# Patient Record
Sex: Female | Born: 1995 | Race: White | Hispanic: No | Marital: Single | Smoking: Never smoker
Health system: Southern US, Community
[De-identification: ages and names within clinical notes are randomized; demographics above are authoritative.]

## PROBLEM LIST (undated history)

## (undated) DIAGNOSIS — M549 Dorsalgia, unspecified: Secondary | ICD-10-CM

## (undated) DIAGNOSIS — G8929 Other chronic pain: Secondary | ICD-10-CM

## (undated) DIAGNOSIS — J45909 Unspecified asthma, uncomplicated: Secondary | ICD-10-CM

## (undated) DIAGNOSIS — F329 Major depressive disorder, single episode, unspecified: Secondary | ICD-10-CM

## (undated) DIAGNOSIS — F32A Depression, unspecified: Secondary | ICD-10-CM

---
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
## 2018-05-18 ENCOUNTER — Emergency Department (HOSPITAL_COMMUNITY)
Admission: EM | Admit: 2018-05-18 | Discharge: 2018-05-19 | Disposition: A | Discharge status: Home / Self Care | Payer: BC Managed Care – PPO | Attending: Emergency Medicine | Admitting: Emergency Medicine

## 2018-05-18 ENCOUNTER — Encounter (HOSPITAL_COMMUNITY): Payer: Self-pay

## 2018-05-18 DIAGNOSIS — T7840XA Allergy, unspecified, initial encounter: Secondary | ICD-10-CM | POA: Diagnosis not present

## 2018-05-18 DIAGNOSIS — R21 Rash and other nonspecific skin eruption: Secondary | ICD-10-CM | POA: Diagnosis present

## 2018-05-18 DIAGNOSIS — Z79899 Other long term (current) drug therapy: Secondary | ICD-10-CM | POA: Diagnosis not present

## 2018-05-18 HISTORY — DX: Unspecified asthma, uncomplicated: J45.909

## 2018-05-18 HISTORY — DX: Dorsalgia, unspecified: M54.9

## 2018-05-18 HISTORY — DX: Other chronic pain: G89.29

## 2018-05-18 LAB — I-STAT BETA HCG BLOOD, ED (MC, WL, AP ONLY)

## 2018-05-18 MED ORDER — SODIUM CHLORIDE 0.9 % IV SOLN
INTRAVENOUS | Status: DC
  Administered 2018-05-18: 23:00:00 via INTRAVENOUS

## 2018-05-18 MED ORDER — FAMOTIDINE IN NACL 20-0.9 MG/50ML-% IV SOLN
20.0000 mg | Freq: Once | INTRAVENOUS | Status: AC
Start: 2018-05-18 — End: 2018-05-18
  Administered 2018-05-18: 20 mg via INTRAVENOUS
  Filled 2018-05-18: qty 50

## 2018-05-18 MED ORDER — EPINEPHRINE 0.3 MG/0.3ML IJ SOAJ
0.3000 mg | Freq: Once | INTRAMUSCULAR | Status: AC
Start: 2018-05-18 — End: 2018-05-18
  Administered 2018-05-18: 0.3 mg via INTRAMUSCULAR
  Filled 2018-05-18: qty 0.3

## 2018-05-18 NOTE — ED Triage Notes (Signed)
Pt arrived by EMS from home. Pt has hx of exercise induced anaphylaxis. Pt ate and went for a walk. Pt started feeling short of breath and had hives all over. Pt took  50mg  of Benadryl at 2022 and self administered an Epi Pen at 2025. EMS arrived and administered 125mg  of Solumedrol and 4mg  of Zofran. Pt alert and oriented and vitals stable on arrival.

## 2018-05-18 NOTE — ED Provider Notes (Signed)
[facility]-EMERGENCY DEPT Provider Note   CSN: [id number] Arrival date & time: 05/18/18  2103     History   Chief Complaint Chief Complaint  Patient presents with  . Allergic Reaction    HPI Felicia Ali is a 22 y.o. female.  The history is provided by the patient, a parent and medical records.  Allergic Reaction  Presenting symptoms: difficulty breathing, itching, rash and wheezing   Presenting symptoms: no difficulty swallowing and no swelling   Severity:  Severe Duration:  1 hour Context comment:  Exercise Relieved by:  Epinephrine and antihistamines Worsened by:  Nothing Ineffective treatments:  None tried   No past medical history on file.  There are no active problems to display for this patient.   No past surgical history on file.   OB History   None      Home Medications    Prior to Admission medications   Medication Sig Start Date End Date Taking? Authorizing Provider  albuterol (PROVENTIL HFA;VENTOLIN HFA) 108 (90 BASE) MCG/ACT inhaler Inhale 2 puffs into the lungs every 6 (six) hours as needed for wheezing.    [provider]  beclomethasone (QVAR) 80 MCG/ACT inhaler Inhale 1 puff into the lungs 2 (two) times daily.    [provider]  diazepam (VALIUM) 5 MG tablet Take 2.5 mg by mouth every 6 (six) hours as needed for anxiety.    [provider]  diphenhydrAMINE (BENADRYL) 25 mg capsule Take 50 mg by mouth every 6 (six) hours as needed for itching or allergies.    [provider]  DULoxetine (CYMBALTA) 60 MG capsule Take 60 mg by mouth daily.    [provider]  fexofenadine (ALLEGRA) 180 MG tablet Take 180 mg by mouth daily as needed (for allergies).    [provider]  hydrOXYzine (ATARAX/VISTARIL) 50 MG tablet Take 50 mg by mouth 3 (three) times daily as needed.    [provider]  metaxalone (SKELAXIN) 800 MG tablet Take 1/2 in morning and afternoon and 1  pill before bedtime for muscle relaxant 02/25/16   Peyton Najjar, MD  montelukast (SINGULAIR) 10 MG tablet Take 10 mg by mouth at bedtime.    [provider]    Family History No family history on file.  Social History Social History   Tobacco Use  . Smoking status: Never Smoker  Substance Use Topics  . Alcohol use: Not on file  . Drug use: Not on file     Allergies   Patient has no known allergies.   Review of Systems Review of Systems  Constitutional: Negative for chills, diaphoresis, fatigue and fever.  HENT: Negative for congestion, ear pain, sore throat and trouble swallowing.   Eyes: Negative for pain and visual disturbance.  Respiratory: Positive for chest tightness, shortness of breath and wheezing. Negative for cough and stridor.   Cardiovascular: Negative for chest pain, palpitations and leg swelling.  Gastrointestinal: Negative for abdominal pain, diarrhea, nausea and vomiting.  Genitourinary: Negative for dysuria, flank pain, frequency and hematuria.  Musculoskeletal: Negative for arthralgias and back pain.  Skin: Positive for itching and rash. Negative for color change.  Neurological: Positive for light-headedness. Negative for dizziness, seizures, syncope and headaches.  All other systems reviewed and are negative.    Physical Exam Updated Vital Signs There were no vitals taken for this visit.  Physical Exam  Constitutional: She is oriented to person, place, and time. She appears well-developed and well-nourished. No distress.  HENT:  Head: Normocephalic and atraumatic.  Nose: Nose normal.  Mouth/Throat: Oropharynx is clear and moist. No oropharyngeal exudate.  Eyes: Pupils are equal, round, and reactive to light. Conjunctivae and EOM are normal.  Neck: Neck supple.  Cardiovascular: Normal rate and regular rhythm.  No murmur heard. Pulmonary/Chest: Effort normal and breath sounds normal. No respiratory distress. She has no wheezes. She has no  rales. She exhibits no tenderness.  Abdominal: Soft. There is no tenderness. There is no guarding.  Musculoskeletal: She exhibits no edema or tenderness.  Neurological: She is alert and oriented to person, place, and time. No sensory deficit. She exhibits normal muscle tone.  Skin: Skin is warm and dry. Capillary refill takes less than 2 seconds. No rash noted. She is not diaphoretic. No erythema.  Psychiatric: She has a normal mood and affect.  Nursing note and vitals reviewed.    ED Treatments / Results  Labs (all labs ordered are listed, but only abnormal results are displayed) Labs Reviewed  I-STAT BETA HCG BLOOD, ED ([facility], [facility], [facility] ONLY)    EKG None  Radiology No results found.  Procedures Procedures (including critical care time)  CRITICAL CARE Performed by: Canary Brim Racer Quam Total critical care time: 35 minutes Critical care time was exclusive of separately billable procedures and treating other patients. Critical care was necessary to treat or prevent imminent or life-threatening deterioration. Anaphylactic reaction requiring epinephrine IM Critical care was time spent personally by me on the following activities: development of treatment plan with patient and/or surrogate as well as nursing, discussions with consultants, evaluation of patient's response to treatment, examination of patient, obtaining history from patient or surrogate, ordering and performing treatments and interventions, ordering and review of laboratory studies, ordering and review of radiographic studies, pulse oximetry and re-evaluation of patient's condition.   Medications Ordered in ED Medications  0.9 %  sodium chloride infusion ( Intravenous New Bag/Given 05/18/18 2242)  EPINEPHrine (EPI-PEN) injection 0.3 mg (0.3 mg Intramuscular Given 05/18/18 2243)  famotidine (PEPCID) IVPB 20 mg premix (0 mg Intravenous Stopped 05/18/18 2339)     Initial Impression / Assessment and Plan / ED Course  I have  reviewed the triage vital signs and the nursing notes.  Pertinent labs & imaging results that were available during my care of the patient were reviewed by me and considered in my medical decision making (see chart for details).     Felicia Ali is a 22 y.o. female with a past medical history significant for exercise-induced anaphylaxis who presents with allergic reaction.  Patient reports that she gets allergic reaction symptoms when she exercises or does any physical activity shortly after eating.  She reports that she had tacos tonight and shortly after went on a walk with friends.  She said she initially started feeling itching all over, then noticed urticarial rash diffusely, and she had tingling of her lips, throat, and tongue before she started feeling short of breath and her throat was closing.  Patient took Benadryl and then an EpiPen at approximately 20 5 PM.  Patient called EMS and was given Solu-Medrol and Zofran as well as fluids.    On arrival, symptoms have resolved.  Patient has no chest pain, shortness breath, palpitations, wheezing, neurologic deficits.  She does report feeling slightly lightheaded still.    Exam otherwise unremarkable.  No wheezing.  No significant tachycardia.    At approximately 1045, patient began feeling the lips tingling and feeling of throat swelling again.  Patient also  felt short of breath.  Patient was given a second epinephrine dose as well as the Pepcid she did not receive initially.  Patient will be observed for period of time longer.  Initial plan is the patient will be reassessed at 3 AM and likely discharge if symptoms improved.    If symptoms improve, patient will give prescription for prednisone for the next several days and follow-up with her PCP.    Patient and family are all on board with the plan and will be observed.  Patient resting comfortably with no further symptoms on reassessment.   Final Clinical Impressions(s) / ED Diagnoses    Final diagnoses:  Allergic reaction, initial encounter    ED Discharge Orders    None      Clinical Impression: 1. Allergic reaction, initial encounter     Disposition: Care transferred to Dr. Patria Maneampos while awaiting reassessment.   Condition: Good     Libbie Bartley, Canary Brimhristopher J, MD 05/19/18 [phone]

## 2018-05-18 NOTE — ED Notes (Signed)
ED Provider at bedside. 

## 2018-05-19 MED ORDER — EPINEPHRINE 0.3 MG/0.3ML IJ SOAJ: 0.3000 mg | Freq: Once | INTRAMUSCULAR | 3 refills | Status: AC

## 2018-05-19 MED ORDER — PREDNISONE 20 MG PO TABS: 40.0000 mg | ORAL_TABLET | Freq: Every day | ORAL | 0 refills | Status: AC

## 2018-05-19 NOTE — ED Notes (Signed)
ED Provider at bedside. 

## 2018-12-27 ENCOUNTER — Encounter (HOSPITAL_COMMUNITY): Payer: Self-pay | Admitting: *Deleted

## 2018-12-27 ENCOUNTER — Encounter (HOSPITAL_COMMUNITY): Payer: Self-pay | Admitting: Emergency Medicine

## 2018-12-27 ENCOUNTER — Inpatient Hospital Stay (HOSPITAL_COMMUNITY)
Admission: AD | Admit: 2018-12-27 | Discharge: 2018-12-30 | DRG: 881 | Disposition: A | Discharge status: Home / Self Care | Payer: BC Managed Care – PPO | Source: Intra-hospital | Attending: Psychiatry | Admitting: Psychiatry

## 2018-12-27 ENCOUNTER — Emergency Department (HOSPITAL_COMMUNITY)
Admission: EM | Admit: 2018-12-27 | Discharge: 2018-12-27 | Disposition: A | Discharge status: Other Acute Inpatient Hospital | Payer: BC Managed Care – PPO | Source: Home / Self Care | Attending: Emergency Medicine | Admitting: Emergency Medicine

## 2018-12-27 ENCOUNTER — Other Ambulatory Visit: Payer: Self-pay

## 2018-12-27 DIAGNOSIS — F322 Major depressive disorder, single episode, severe without psychotic features: Secondary | ICD-10-CM | POA: Diagnosis not present

## 2018-12-27 DIAGNOSIS — F329 Major depressive disorder, single episode, unspecified: Secondary | ICD-10-CM | POA: Diagnosis present

## 2018-12-27 DIAGNOSIS — F332 Major depressive disorder, recurrent severe without psychotic features: Secondary | ICD-10-CM

## 2018-12-27 DIAGNOSIS — J45909 Unspecified asthma, uncomplicated: Secondary | ICD-10-CM | POA: Insufficient documentation

## 2018-12-27 DIAGNOSIS — Z915 Personal history of self-harm: Secondary | ICD-10-CM | POA: Insufficient documentation

## 2018-12-27 DIAGNOSIS — Z79899 Other long term (current) drug therapy: Secondary | ICD-10-CM

## 2018-12-27 DIAGNOSIS — R45851 Suicidal ideations: Secondary | ICD-10-CM

## 2018-12-27 DIAGNOSIS — Z880 Allergy status to penicillin: Secondary | ICD-10-CM

## 2018-12-27 DIAGNOSIS — Z818 Family history of other mental and behavioral disorders: Secondary | ICD-10-CM | POA: Diagnosis not present

## 2018-12-27 DIAGNOSIS — F419 Anxiety disorder, unspecified: Secondary | ICD-10-CM | POA: Diagnosis present

## 2018-12-27 DIAGNOSIS — M549 Dorsalgia, unspecified: Secondary | ICD-10-CM | POA: Diagnosis present

## 2018-12-27 DIAGNOSIS — G8929 Other chronic pain: Secondary | ICD-10-CM | POA: Diagnosis present

## 2018-12-27 DIAGNOSIS — F32A Depression, unspecified: Secondary | ICD-10-CM

## 2018-12-27 HISTORY — DX: Depression, unspecified: F32.A

## 2018-12-27 HISTORY — DX: Major depressive disorder, single episode, unspecified: F32.9

## 2018-12-27 LAB — COMPREHENSIVE METABOLIC PANEL
ALK PHOS: 83 U/L (ref 38–126)
ALT: 16 U/L (ref 0–44)
AST: 14 U/L — AB (ref 15–41)
Albumin: 3.7 g/dL (ref 3.5–5.0)
Anion gap: 8 (ref 5–15)
BILIRUBIN TOTAL: 0.4 mg/dL (ref 0.3–1.2)
BUN: 12 mg/dL (ref 6–20)
CO2: 24 mmol/L (ref 22–32)
Calcium: 8.7 mg/dL — ABNORMAL LOW (ref 8.9–10.3)
Chloride: 106 mmol/L (ref 98–111)
Creatinine, Ser: 0.97 mg/dL (ref 0.44–1.00)
GFR calc Af Amer: >60 mL/min (ref >60)
GFR calc non Af Amer: >60 mL/min (ref >60)
GLUCOSE: 84 mg/dL (ref 70–99)
Potassium: 3.4 mmol/L — ABNORMAL LOW (ref 3.5–5.1)
Sodium: 138 mmol/L (ref 135–145)
TOTAL PROTEIN: 7 g/dL (ref 6.5–8.1)

## 2018-12-27 LAB — CBC
HEMATOCRIT: 41.7 % (ref 36.0–46.0)
HEMOGLOBIN: 13.1 g/dL (ref 12.0–15.0)
MCH: 26.1 pg (ref 26.0–34.0)
MCHC: 31.4 g/dL (ref 30.0–36.0)
MCV: 83.1 fL (ref 80.0–100.0)
Platelets: 304 10*3/uL (ref 150–400)
RBC: 5.02 MIL/uL (ref 3.87–5.11)
RDW: 14.4 % (ref 11.5–15.5)
WBC: 10.2 10*3/uL (ref 4.0–10.5)
nRBC: 0 % (ref 0.0–0.2)

## 2018-12-27 LAB — I-STAT BETA HCG BLOOD, ED (MC, WL, AP ONLY)

## 2018-12-27 LAB — ETHANOL: Alcohol, Ethyl (B): <10 mg/dL (ref <10)

## 2018-12-27 LAB — SALICYLATE LEVEL: Salicylate Lvl: <7 mg/dL (ref 2.8–30.0)

## 2018-12-27 LAB — ACETAMINOPHEN LEVEL: Acetaminophen (Tylenol), Serum: <10 ug/mL — ABNORMAL LOW (ref 10–30)

## 2018-12-27 NOTE — BH Assessment (Addendum)
Tele Assessment Note   Patient Name: Felicia Ali MRN: [id number] Referring Physician: Dr. Mancel Bale, MD Location of Patient: [facility] ED Location of Provider: Behavioral Health TTS Department  [name] is a 23 y.o. female who was brought to [facility] by her mother at her psychiatrist, Dr. Gloris Ali, recommendation after she expressed SI with thoughts of possibilities for killing herself, such as walking in front of cars or cutting herself. Pt shares, and her mother corroborates, that she was previously on 60mg  of Cymbalta for several years and that it was working well until around September 2019, though pt was abroad in [place] and was unable to have her prescription changed. When pt returned home in December, her Cymbalta was increased to 90mg , though pt had a negative reaction to it and it was reduced back to 60mg  and Wellbutrin was started. After about a week, pt began to experience SI with this combination, and her mother shared pt complained that her "brain was hurting," so pt's psychiatrist, Dr. Jannifer Ali, increased the Wellbutrin to get it to a therapeutic level. 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 Pt shares she began to experience not only SI, but also means in which she could kill herself. Pt called her mother, who began to drive to [facility] from [place]. Pt also called Dr. Jannifer Ali, who recommended pt travel to the hospital for an evaluation immediately. Pt's mother reached pt and brought her back to [facility] and brought her to [facility].  Pt denies HI, AVH, access to weapons (including guns), and any involvement in the legal system. She shares she had one incident of NSSIB when she cut herself in October 2019,  which she hadn't previously done for 5 years. Pt states she drinks 1-2 glasses of wine 1-2 nights/week.  Pt's mother expressed concern for pt's safety but also shared belief in her ability to keep pt safe. She stated the plan is for them to contact pt's psychiatrist and for pt to f/t with any recommendations. Pt's mother shared pt's meds "aren't quite right" and that the news from the pain Ali and the frustration with the fundraising group were what caused pt's SI. Pt's mother shares pt has been treated for depression for four years. She expressed that pt experienced much trauma throughout middle and high school due to a rare food anaphylactic disorder which resulted in pt believing that there was a possibility of her dying any night that she danced, causing her much anxiety; since pt's parents did not know about pt's anxiety, they did not know to put her in therapy, which pt's mother appeared to express much guilt and remorse for, as evidenced by her voice tone and the wording she used when speaking when clinician. Pt's mother expressed an understanding regarding the hospital process and that a PA will be reviewing pt's information and making a recommendation regarding what he believes are in pt's best interests.  Pt is oriented x4. Her recent and remote memory is intact. Pt was pleasant and cooperative throughout the assessment process. Pt's insight, judgement, and impulse control is fair at this time.   Diagnosis: F33.2, Major depressive disorder, Recurrent episode, Severe   Past Medical History:  Past Medical History:  Diagnosis Date  . Asthma   .  Chronic back pain   . Depression     History reviewed. No pertinent surgical history.  Family History: No family history on file.  Social History:  reports that she has never smoked. She has never used smokeless tobacco. She reports previous alcohol use. She reports that she does not use drugs.  Additional Social History:  Alcohol / Drug  Use Pain Medications: Please see MAR Prescriptions: Please see MAR Over the Counter: Please see MAR History of alcohol / drug use?: Yes Longest period of sobriety (when/how long): Unknown Substance #1 Name of Substance 1: EtOH 1 - Age of First Use: 21 1 - Amount (size/oz): 1-2 glasses of wine 1 - Frequency: 1-2 x/week 1 - Duration: Unknown 1 - Last Use / Amount: 12/21/2018  CIWA: CIWA-Ar BP: 129/70 Pulse Rate: 93 COWS:    Allergies:  Allergies  Allergen Reactions  . Amoxicillin Hives    Did it involve swelling of the face/tongue/throat, SOB, or low BP? Yes Did it involve sudden or severe rash/hives, skin peeling, or any reaction on the inside of your mouth or nose? No Did you need to seek medical attention at a hospital or doctor's office? Unknown When did it last happen? Childhood allergy If all above answers are "NO", may proceed with cephalosporin use.     Home Medications: (Not in a hospital admission)   OB/GYN Status:  No LMP recorded.  General Assessment Data Location of Assessment: [facility] TTS Assessment: In system Is this a Tele or Face-to-Face Assessment?: Tele Assessment Is this an Initial Assessment or a Re-assessment for this encounter?: Initial Assessment Patient Accompanied by:: N/A Language Other than English: No Living Arrangements: (Pt lives with two roommates at college in [place]) What gender do you identify as?: Female Marital status: Single Maiden name: Linck Pregnancy Status: No Living Arrangements: Non-relatives/Friends Can pt return to current living arrangement?: Yes Admission Status: Voluntary Is patient capable of signing voluntary admission?: Yes Referral Source: Self/Family/Friend Insurance type: BCBS     Crisis Care Plan Living Arrangements: Non-relatives/Friends Legal Guardian: (N/A) Name of Psychiatrist: Dr. 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 : Yes Suicidal Intent: Yes-Currently Present Has patient had any suicidal intent within the past 6 months prior to admission? : Yes Is patient at risk for suicide?: Yes Suicidal Plan?: Yes-Currently Present Has patient had any suicidal plan within the past 6 months prior to admission? : No Specify Current Suicidal Plan: Pt has thought about walking in front of a car, cutting self Access to Means: Yes Specify Access to Suicidal Means: Pt has access to traffic and razors What has been your use of drugs/alcohol within the last 12 months?: Pt drinks EtOH occasionally Previous Attempts/Gestures: Yes How many times?: 1 Other Self Harm Risks: None noted Triggers for Past Attempts: Other (Comment)(Sister went to college, physical issues) Intentional Self Injurious Behavior: Cutting Comment - Self Injurious Behavior: Pt engaged in NSSIB via cutting in October 2019 Family Suicide History: No Recent stressful life event(s): Other (Comment)(Medication changes, future changes) Persecutory voices/beliefs?: No Depression: Yes  Depression Symptoms: Guilt, Isolating, Feeling angry/irritable Substance abuse history and/or treatment for substance abuse?: No Suicide prevention information given to non-admitted patients: Not applicable  Risk to Others within the past 6 months Homicidal Ideation: No Does patient have any lifetime risk of violence toward others beyond the six months prior to admission? : No Thoughts of Harm to Others: No Current Homicidal Intent: No Current  Homicidal Plan: No Access to Homicidal Means: No Identified Victim: None noted History of harm to others?: No Assessment of Violence: On admission Violent Behavior Description: None noted Does patient have access to weapons?: No(Pt denies access to weapons, including guns) Criminal Charges Pending?: No Does patient have a court date: No Is patient on probation?: No  Psychosis Hallucinations: None noted Delusions: None noted  Mental Status Report Appearance/Hygiene: In scrubs Eye Contact: Good Motor Activity: Freedom of movement(Pt is sitting up in her hospital bed) Speech: Logical/coherent Level of Consciousness: Alert Mood: Pleasant Affect: Appropriate to circumstance Anxiety Level: Minimal Thought Processes: Coherent, Relevant Judgement: Unimpaired Orientation: Person, Place, Time, Situation Obsessive Compulsive Thoughts/Behaviors: Minimal  Cognitive Functioning Concentration: Normal Memory: Recent Intact, Remote Intact Is patient IDD: No Insight: Good Impulse Control: Fair Appetite: Fair Have you had any weight changes? : No Change Sleep: Increased Total Hours of Sleep: 8 Vegetative Symptoms: Staying in bed  ADLScreening [facility] Assessment Services) Patient's cognitive ability adequate to safely complete daily activities?: Yes Patient able to express need for assistance with ADLs?: Yes Independently performs ADLs?: Yes (appropriate for developmental age)  Prior Inpatient Therapy Prior Inpatient Therapy: No  Prior Outpatient Therapy Prior Outpatient Therapy: No Does patient have an ACCT team?: No Does patient have Intensive In-House Services?  : No Does patient have [facility] services? : No Does patient have P4CC services?: No  ADL Screening (condition at time of admission) Patient's cognitive ability adequate to safely complete daily activities?: Yes Is the patient deaf or have difficulty hearing?: No Does the patient have difficulty seeing, even when wearing  glasses/contacts?: No Does the patient have difficulty concentrating, remembering, or making decisions?: No Patient able to express need for assistance with ADLs?: Yes Does the patient have difficulty dressing or bathing?: No Independently performs ADLs?: Yes (appropriate for developmental age) Does the patient have difficulty walking or climbing stairs?: No Weakness of Legs: None Weakness of Arms/Hands: None  Home Assistive Devices/Equipment Home Assistive Devices/Equipment: Eyeglasses, Brace (specify type)  Therapy Consults (therapy consults require a physician order) PT Evaluation Needed: No OT Evalulation Needed: No SLP Evaluation Needed: No Abuse/Neglect Assessment (Assessment to be complete while patient is alone) Abuse/Neglect Assessment Can Be Completed: Yes Physical Abuse: Denies Verbal Abuse: Denies Sexual Abuse: Denies Exploitation of patient/patient's resources: Denies Self-Neglect: Denies Values / Beliefs Cultural Requests During Hospitalization: None Spiritual Requests During Hospitalization: None Consults Spiritual Care Consult Needed: No Social Work Consult Needed: No Merchant navy officerAdvance Directives (For Healthcare) Does Patient Have a Medical Advance Directive?: No Would patient like information on creating a medical advance directive?: No - Patient declined        Disposition: Donell SievertSpencer Simon, PA, reviewed pt's chart and information and determined pt meets criteria for inpatient hospitalization. Pt has been [facility] Room 401-1 and can arrive any time before 2330. This information was provided to her nurse, Consuella LoseElaine, RN, at 2237.  Disposition Initial Assessment Completed for this Encounter: Yes  This service was provided via telemedicine using a 2-way, interactive audio and video technology.  Names of all persons participating in this telemedicine service and their  role in this encounter. Name: Gabrella Stroh Role: Patient  Name: Starling Manns Role: Patient's  Mother  Name: Duard Brady Role: Clinician    Ralph Dowdy 12/27/2018 10:12 PM

## 2018-12-27 NOTE — ED Notes (Signed)
TTS assessment in progress via tele psych. 

## 2018-12-27 NOTE — Progress Notes (Signed)
Felicia Ali is a 23 year old female pt admitted on voluntary basis. On admission, she spoke about how she was having a good day and then all of a sudden she felt suicidal and reports unsure as to why, however she is able to contract for safety in the hospital. She reports that she has been battling depression for a few years now and sees Dr. Jannifer Franklin for medication management. She reports that some medications were adjusted in January and reports that she has been taking all of her medications as prescribed. She does endorse occasional alcohol use but denies any other substance use. She reports that she is currently in school at [employer] and reports that she lives with roommates off campus currently and reports that she lives with her parents when school is not in session. She was cooperative during admission process, was oriented to the milieu and safety maintained.

## 2018-12-27 NOTE — ED Triage Notes (Signed)
Per pt, states she has had a lot of medication changes-states she came home from studying abroad in [place] suicidal thoughts on and off-states she sees a pain management MD and has a therapist in [place] and [place]

## 2018-12-27 NOTE — ED Provider Notes (Signed)
[facility]-EMERGENCY DEPT Provider Note   CSN: [id number] Arrival date & time: 12/27/18  1810    History   Chief Complaint Chief Complaint  Patient presents with  . Suicidal    HPI Felicia Ali is a 23 y.o. female.     HPI  She presents for evaluation of suicidal thoughts, with visualization of images of herself cutting herself.  Onset of symptoms today.  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  She has been on various medications for depression recently.  She is not see a compress regularly but did have an initial visit with a therapist last week.  She plans on seeing that therapist, letter this week.  She denies recent illnesses.  She is taking her usual medications.  She does not use alcohol or take illegal drugs.  [place]mates there.  Her mother brought her here, from [facility], today.  There are no other known modifying factors.   Past Medical History:  Diagnosis Date  . Asthma   . Chronic back pain   . Depression     There are no active problems to display for this patient.   History reviewed. No pertinent surgical history.   OB History   No obstetric history on file.      Home Medications    Prior to Admission medications   Medication Sig Start Date End Date Taking? Authorizing Provider  BLISOVI 24 FE 1-20 MG-MCG(24) tablet Take 1 tablet by mouth daily. 04/04/18  Yes [provider]  buPROPion (WELLBUTRIN XL) 150 MG 24 hr tablet Take 300 mg by mouth daily. 11/29/18  Yes [provider]  DULoxetine (CYMBALTA) 60 MG capsule Take 60 mg by mouth daily.   Yes [provider]  fexofenadine (ALLEGRA) 180 MG tablet Take 180 mg by mouth daily.    Yes [provider]    hydrOXYzine (ATARAX/VISTARIL) 50 MG tablet Take 50 mg by mouth at bedtime.    Yes [provider]  LYRICA 150 MG capsule Take 150 mg by mouth 3 (three) times daily.  05/14/18  Yes [provider]  mometasone (ELOCON) 0.1 % ointment Apply 1 application topically daily.  06/06/18  Yes [provider]  montelukast (SINGULAIR) 10 MG tablet Take 10 mg by mouth at bedtime.   Yes [provider]  ranitidine (ZANTAC) 150 MG tablet Take 150 mg by mouth 2 (two) times daily.  04/30/18  Yes [provider]  tiZANidine (ZANAFLEX) 2 MG tablet Take 2-4 mg by mouth at bedtime as needed for muscle spasms.  05/12/18  Yes [provider]  traZODone (DESYREL) 100 MG tablet TAKE 1 TABLET AT BEDTIME AS NEEDED FOR SLEEP 03/03/18  Yes [provider]  metaxalone (SKELAXIN) 800 MG tablet Take 1/2 in morning and afternoon and 1 pill before bedtime for muscle relaxant Patient not taking: Reported on 05/18/2018 02/25/16   Peyton Najjar, MD    Family History No family history on file.  Social History Social History   Tobacco Use  . Smoking status: Never Smoker  . Smokeless tobacco: Never Used  Substance Use Topics  . Alcohol use: Not Currently    Alcohol/week: 0.0 standard drinks  . Drug use: Never     Allergies  Amoxicillin   Review of Systems Review of Systems  All other systems reviewed and are negative.    Physical Exam Updated Vital Signs BP 129/70 (BP Location: Left Arm)   Pulse 93   Temp 98 F (36.7 C) (Oral)   Resp 16   SpO2 100%   Physical Exam Vitals signs and nursing note reviewed.  Constitutional:      General: She is not in acute distress.    Appearance: Normal appearance. She is well-developed. She is not ill-appearing, toxic-appearing or diaphoretic.  HENT:     Head: Normocephalic and atraumatic.     Right Ear: External ear normal.     Left Ear: External ear normal.  Eyes:     Conjunctiva/sclera: Conjunctivae normal.      Pupils: Pupils are equal, round, and reactive to light.  Neck:     Musculoskeletal: Normal range of motion and neck supple.     Trachea: Phonation normal.  Cardiovascular:     Rate and Rhythm: Normal rate and regular rhythm.  Pulmonary:     Effort: Pulmonary effort is normal.  Musculoskeletal: Normal range of motion.  Skin:    General: Skin is warm and dry.  Neurological:     Mental Status: She is alert and oriented to person, place, and time.     Cranial Nerves: No cranial nerve deficit.     Sensory: No sensory deficit.     Motor: No abnormal muscle tone.     Coordination: Coordination normal.  Psychiatric:        Behavior: Behavior normal.        Thought Content: Thought content normal.        Judgment: Judgment normal.     Comments: She appears depressed      ED Treatments / Results  Labs (all labs ordered are listed, but only abnormal results are displayed) Labs Reviewed  COMPREHENSIVE METABOLIC PANEL - Abnormal; Notable for the following components:      Result Value   Potassium 3.4 (*)    Calcium 8.7 (*)    AST 14 (*)    All other components within normal limits  ACETAMINOPHEN LEVEL - Abnormal; Notable for the following components:   Acetaminophen (Tylenol), Serum <10 (*)    All other components within normal limits  ETHANOL  SALICYLATE LEVEL  CBC  RAPID URINE DRUG SCREEN, HOSP PERFORMED  I-STAT BETA HCG BLOOD, ED ([facility], [facility], [facility] ONLY)    EKG None  Radiology No results found.  Procedures Procedures (including critical care time)  Medications Ordered in ED Medications - No data to display   Initial Impression / Assessment and Plan / ED Course  I have reviewed the triage vital signs and the nursing notes.  Pertinent labs & imaging results that were available during my care of the patient were reviewed by me and considered in my medical decision making (see chart for details).  Clinical Course as of Dec 26 2224  Tue Dec 27, 2018  2224 Normal   I-Stat beta hCG blood, ED [EW]  2224 Normal  Ethanol [EW]  2224 Normal  cbc [EW]  2224 Normal  Acetaminophen level(!) [EW]  2224 Normal  Salicylate level [EW]  2224 Normal except potassium slightly low, calcium low, AST low  Comprehensive metabolic panel(!) [EW]  2224 At this point the patient is medically cleared for treatment by psychiatry   [EW]    Clinical Course User Index [EW] Mancel Bale, MD        Patient  Vitals for the past 24 hrs:  BP Temp Temp src Pulse Resp SpO2  12/27/18 2013 129/70 98 F (36.7 C) Oral 93 16 100 %    TTS consult  Medical Decision Making: Depression with suicidal ideation and stress, with chronic pain.  CRITICAL CARE-no Performed by: Mancel Bale  Nursing Notes Reviewed/ Care Coordinated Applicable Imaging Reviewed Interpretation of Laboratory Data incorporated into ED treatment  Plan-as per TTS in conjunction with oncoming provider team   Final Clinical Impressions(s) / ED Diagnoses   Final diagnoses:  Depression, unspecified depression type  Suicidal ideation    ED Discharge Orders    None       Mancel Bale, MD 12/27/18 2226

## 2018-12-27 NOTE — ED Notes (Signed)
TTS assessment continues. 

## 2018-12-27 NOTE — ED Notes (Signed)
TTS equipment removed from room.

## 2018-12-27 NOTE — ED Notes (Signed)
Mother & pt provided departmental rule sheet.  Mother stated to pt "I'm not going to leave you here.  I'll be out in the waiting room."  Pt is tearful and not wanting mother to leave.

## 2018-12-27 NOTE — ED Notes (Signed)
Mother in with pt.

## 2018-12-27 NOTE — ED Notes (Signed)
[facility] transport at bedside to transport pt to [facility].

## 2018-12-27 NOTE — ED Notes (Signed)
TTS equipment placed in room. 

## 2018-12-27 NOTE — Progress Notes (Signed)
Pt has been accepted at [facility] and should arrive prior to 2330. This information was provided to pt's nurse, Forde Dandy, at [phone].  Room: 401-1 Accepting: Donell Sievert, PA Attending: Dr. Jama Flavors, MD Call to Report: [phone]

## 2018-12-27 NOTE — Tx Team (Signed)
Initial Treatment Plan 12/27/2018 11:49 PM Felicia Ali MGN:[id number]    PATIENT STRESSORS: Educational concerns Medication change or noncompliance   PATIENT STRENGTHS: Ability for insight Average or above average intelligence Capable of independent living Communication skills General fund of knowledge Motivation for treatment/growth Supportive family/friends   PATIENT IDENTIFIED PROBLEMS: Depression  Suicidal thoughts "Figure out what's going on with me"                     DISCHARGE CRITERIA:  Ability to meet basic life and health needs Improved stabilization in mood, thinking, and/or behavior Reduction of life-threatening or endangering symptoms to within safe limits Verbal commitment to aftercare and medication compliance  PRELIMINARY DISCHARGE PLAN: Attend aftercare/continuing care group Return to previous living arrangement  PATIENT/FAMILY INVOLVEMENT: This treatment plan has been presented to and reviewed with the patient, Felicia Ali, and/or family member, .  The patient and family have been given the opportunity to ask questions and make suggestions.  Latana Colin, [place], [place] 12/27/2018, 11:49 PM

## 2018-12-27 NOTE — ED Notes (Signed)
Pt stated "I tried to kill myself last October.  I made a cut.  I've been having thoughts today but no plan."

## 2018-12-28 ENCOUNTER — Other Ambulatory Visit: Payer: Self-pay

## 2018-12-28 DIAGNOSIS — F322 Major depressive disorder, single episode, severe without psychotic features: Secondary | ICD-10-CM

## 2018-12-28 MED ORDER — FAMOTIDINE 10 MG PO TABS
10.0000 mg | ORAL_TABLET | Freq: Every day | ORAL | Status: DC
  Administered 2018-12-28 – 2018-12-30 (×3): 10 mg via ORAL
  Filled 2018-12-28 (×5): qty 1

## 2018-12-28 MED ORDER — ACETAMINOPHEN 325 MG PO TABS: 650.0000 mg | ORAL_TABLET | Freq: Four times a day (QID) | ORAL | Status: DC | PRN

## 2018-12-28 MED ORDER — TRAZODONE HCL 100 MG PO TABS
100.0000 mg | ORAL_TABLET | Freq: Every evening | ORAL | Status: DC | PRN
  Administered 2018-12-28 – 2018-12-29 (×3): 100 mg via ORAL
  Filled 2018-12-28 (×3): qty 1

## 2018-12-28 MED ORDER — HYDROXYZINE HCL 25 MG PO TABS
25.0000 mg | ORAL_TABLET | Freq: Every day | ORAL | Status: DC
  Administered 2018-12-28 – 2018-12-29 (×2): 25 mg via ORAL
  Filled 2018-12-28 (×4): qty 1

## 2018-12-28 MED ORDER — MAGNESIUM HYDROXIDE 400 MG/5ML PO SUSP: 30.0000 mL | Freq: Every day | ORAL | Status: DC | PRN

## 2018-12-28 MED ORDER — HYDROXYZINE HCL 50 MG PO TABS
50.0000 mg | ORAL_TABLET | Freq: Every day | ORAL | Status: DC
  Administered 2018-12-28: 50 mg via ORAL
  Filled 2018-12-28 (×3): qty 1

## 2018-12-28 MED ORDER — MONTELUKAST SODIUM 10 MG PO TABS
10.0000 mg | ORAL_TABLET | Freq: Every day | ORAL | Status: DC
  Administered 2018-12-28 – 2018-12-29 (×2): 10 mg via ORAL
  Filled 2018-12-28 (×4): qty 1

## 2018-12-28 MED ORDER — LORATADINE 10 MG PO TABS
10.0000 mg | ORAL_TABLET | Freq: Every day | ORAL | Status: DC
  Administered 2018-12-28 – 2018-12-30 (×3): 10 mg via ORAL
  Filled 2018-12-28 (×6): qty 1

## 2018-12-28 MED ORDER — ALUM & MAG HYDROXIDE-SIMETH 200-200-20 MG/5ML PO SUSP: 30.0000 mL | ORAL | Status: DC | PRN

## 2018-12-28 MED ORDER — PREGABALIN 75 MG PO CAPS: 150.0000 mg | ORAL_CAPSULE | Freq: Three times a day (TID) | ORAL | Status: DC

## 2018-12-28 MED ORDER — DULOXETINE HCL 60 MG PO CPEP
60.0000 mg | ORAL_CAPSULE | Freq: Every day | ORAL | Status: DC
  Administered 2018-12-28 – 2018-12-30 (×3): 60 mg via ORAL
  Filled 2018-12-28 (×6): qty 1

## 2018-12-28 MED ORDER — PREGABALIN 75 MG PO CAPS
150.0000 mg | ORAL_CAPSULE | Freq: Three times a day (TID) | ORAL | Status: DC
  Administered 2018-12-28 – 2018-12-30 (×8): 150 mg via ORAL
  Filled 2018-12-28 (×8): qty 2

## 2018-12-28 NOTE — Tx Team (Signed)
Interdisciplinary Treatment and Diagnostic Plan Update  12/28/2018 Time of Session:  Felicia Ali MRN: [id number]  Principal Diagnosis: MDD (major depressive disorder), severe ([place])  Secondary Diagnoses: Principal Problem:   MDD (major depressive disorder), severe ([place])   Current Medications:  Current Facility-Administered Medications  Medication Dose Route Frequency Provider Last Rate Last Dose  . acetaminophen (TYLENOL) tablet 650 mg  650 mg Oral Q6H PRN Laverle Hobby, PA-C      . alum & mag hydroxide-simeth (MAALOX/MYLANTA) 200-200-20 MG/5ML suspension 30 mL  30 mL Oral Q4H PRN Laverle Hobby, PA-C      . DULoxetine (CYMBALTA) DR capsule 60 mg  60 mg Oral Daily Cobos, Myer Peer, MD   60 mg at 12/28/18 1109  . famotidine (PEPCID) tablet 10 mg  10 mg Oral Daily Cobos, Myer Peer, MD   10 mg at 12/28/18 1216  . hydrOXYzine (ATARAX/VISTARIL) tablet 25 mg  25 mg Oral QHS Cobos, Fernando A, MD      . loratadine (CLARITIN) tablet 10 mg  10 mg Oral Daily Cobos, Myer Peer, MD   10 mg at 12/28/18 1109  . magnesium hydroxide (MILK OF MAGNESIA) suspension 30 mL  30 mL Oral Daily PRN Patriciaann Clan E, PA-C      . montelukast (SINGULAIR) tablet 10 mg  10 mg Oral QHS Cobos, Fernando A, MD      . pregabalin (LYRICA) capsule 150 mg  150 mg Oral TID Cobos, Myer Peer, MD   150 mg at 12/28/18 1216  . traZODone (DESYREL) tablet 100 mg  100 mg Oral QHS PRN Laverle Hobby, PA-C   100 mg at 12/28/18 0019   PTA Medications: Medications Prior to Admission  Medication Sig Dispense Refill Last Dose  . BLISOVI 24 FE 1-20 MG-MCG(24) tablet Take 1 tablet by mouth daily.  2 12/26/2018 at Unknown time  . buPROPion (WELLBUTRIN XL) 150 MG 24 hr tablet Take 300 mg by mouth daily.   12/27/2018 at Unknown time  . DULoxetine (CYMBALTA) 60 MG capsule Take 60 mg by mouth daily.   12/27/2018 at Unknown time  . fexofenadine (ALLEGRA) 180 MG tablet Take 180 mg by mouth daily.    12/27/2018 at Unknown time  .  hydrOXYzine (ATARAX/VISTARIL) 50 MG tablet Take 50 mg by mouth at bedtime.    12/26/2018 at Unknown time  . LYRICA 150 MG capsule Take 150 mg by mouth 3 (three) times daily.    12/27/2018 at Unknown time  . metaxalone (SKELAXIN) 800 MG tablet Take 1/2 in morning and afternoon and 1 pill before bedtime for muscle relaxant (Patient not taking: Reported on 05/18/2018) 20 tablet 0 Not Taking at Unknown time  . mometasone (ELOCON) 0.1 % ointment Apply 1 application topically daily.    12/26/2018 at Unknown time  . montelukast (SINGULAIR) 10 MG tablet Take 10 mg by mouth at bedtime.   12/26/2018 at Unknown time  . ranitidine (ZANTAC) 150 MG tablet Take 150 mg by mouth 2 (two) times daily.   5 12/27/2018 at Unknown time  . tiZANidine (ZANAFLEX) 2 MG tablet Take 2-4 mg by mouth at bedtime as needed for muscle spasms.   2 Past Month at Unknown time  . traZODone (DESYREL) 100 MG tablet TAKE 1 TABLET AT BEDTIME AS NEEDED FOR SLEEP  0 12/26/2018 at Unknown time    Patient Stressors: Educational concerns Medication change or noncompliance  Patient Strengths: Ability for insight Average or above average intelligence Capable of independent living Curator  fund of knowledge Motivation for treatment/growth Supportive family/friends  Treatment Modalities: Medication Management, Group therapy, Case management,  1 to 1 session with clinician, Psychoeducation, Recreational therapy.   Physician Treatment Plan for Primary Diagnosis: MDD (major depressive disorder), severe ([place]) Long Term Goal(s): Improvement in symptoms so as ready for discharge Improvement in symptoms so as ready for discharge   Short Term Goals: Ability to identify changes in lifestyle to reduce recurrence of condition will improve Ability to verbalize feelings will improve Ability to disclose and discuss suicidal ideas Ability to demonstrate self-control will improve Ability to identify and develop effective coping behaviors will  improve  Medication Management: Evaluate patient's response, side effects, and tolerance of medication regimen.  Therapeutic Interventions: 1 to 1 sessions, Unit Group sessions and Medication administration.  Evaluation of Outcomes: Not Met  Physician Treatment Plan for Secondary Diagnosis: Principal Problem:   MDD (major depressive disorder), severe ([place])  Long Term Goal(s): Improvement in symptoms so as ready for discharge Improvement in symptoms so as ready for discharge   Short Term Goals: Ability to identify changes in lifestyle to reduce recurrence of condition will improve Ability to verbalize feelings will improve Ability to disclose and discuss suicidal ideas Ability to demonstrate self-control will improve Ability to identify and develop effective coping behaviors will improve     Medication Management: Evaluate patient's response, side effects, and tolerance of medication regimen.  Therapeutic Interventions: 1 to 1 sessions, Unit Group sessions and Medication administration.  Evaluation of Outcomes: Not Met   RN Treatment Plan for Primary Diagnosis: MDD (major depressive disorder), severe ([place]) Long Term Goal(s): Knowledge of disease and therapeutic regimen to maintain health will improve  Short Term Goals: Ability to participate in decision making will improve, Ability to verbalize feelings will improve, Ability to disclose and discuss suicidal ideas, Ability to identify and develop effective coping behaviors will improve and Compliance with prescribed medications will improve  Medication Management: RN will administer medications as ordered by provider, will assess and evaluate patient's response and provide education to patient for prescribed medication. RN will report any adverse and/or side effects to prescribing provider.  Therapeutic Interventions: 1 on 1 counseling sessions, Psychoeducation, Medication administration, Evaluate responses to treatment, Monitor vital  signs and CBGs as ordered, Perform/monitor CIWA, COWS, AIMS and Fall Risk screenings as ordered, Perform wound care treatments as ordered.  Evaluation of Outcomes: Not Met   LCSW Treatment Plan for Primary Diagnosis: MDD (major depressive disorder), severe ([place]) Long Term Goal(s): Safe transition to appropriate next level of care at discharge, Engage patient in therapeutic group addressing interpersonal concerns.  Short Term Goals: Engage patient in aftercare planning with referrals and resources  Therapeutic Interventions: Assess for all discharge needs, 1 to 1 time with Social worker, Explore available resources and support systems, Assess for adequacy in community support network, Educate family and significant other(s) on suicide prevention, Complete Psychosocial Assessment, Interpersonal group therapy.  Evaluation of Outcomes: Not Met   Progress in Treatment: Attending groups: No. New to unit  Participating in groups: No. Taking medication as prescribed: Yes. Toleration medication: Yes. Family/Significant other contact made: Yes, individual(s) contacted:  the patient's mother Patient understands diagnosis: Yes. Discussing patient identified problems/goals with staff: Yes. Medical problems stabilized or resolved: Yes. Denies suicidal/homicidal ideation: Yes. Issues/concerns per patient self-inventory: No. Other:   New problem(s) identified: None  New Short Term/Long Term Goal(s): medication stabilization, elimination of SI thoughts, development of comprehensive mental wellness plan.   Patient Goals:  Figure out what's  going on with me  Discharge Plan or Barriers:  Patient is a  lives with friends in [place], [place]. She plans to continue to follow up with Dr. Darleene Cleaver at [facility] for medication management services and [facility] for therapy services. CSW will continue to follow and assess to determine whether the patient would like to be referred to  [facility] for additional services.   Reason for Continuation of Hospitalization: Anxiety Depression Medication stabilization Suicidal ideation  Estimated Length of Stay: 12/30/2018  Attendees: Patient: 12/28/2018 12:58 PM  Physician: Dr. Neita Garnet, MD 12/28/2018 12:58 PM  Nursing: Rise Paganini.Raliegh Ip, RN 12/28/2018 12:58 PM  RN Care Manager: 12/28/2018 12:58 PM  Social Worker: Radonna Ricker, [place]  12/28/2018 12:58 PM  Recreational Therapist:  12/28/2018 12:58 PM  Other: Harriett Sine, NP  12/28/2018 12:58 PM  Other:  12/28/2018 12:58 PM  Other: 12/28/2018 12:58 PM    Scribe for Treatment Team: Marylee Floras, [place] 12/28/2018 12:58 PM

## 2018-12-28 NOTE — BHH Counselor (Signed)
Adult Comprehensive Assessment  Patient ID: Felicia Ali, female   DOB: [birth year], 23 y.o.   MRN: [id number]  Information Source: Information source: Patient  Current Stressors:  Patient states their primary concerns and needs for treatment are:: "I was scared.  I didn't think I was actually going to hurt myself.  But the fact my brain was telling me to was scary." Patient states their goals for this hospitilization and ongoing recovery are:: Medication adjustments, learn how to manage social stressors Educational / Learning stressors: Very stressful, in a demanding school, is very driven "a little too driven, 2 majors and a minor."  Has an internship over the summer.  Is taking the GRE soon, will be applying for graduate school soon.  Has very hard classes.  Hard to focus on the Wellbutrin.  Is in charge of [employer] and it lost a lot of members while she was abroad studying last semester, which makes her feel guilty.  Studied abroad in [place] last semester. Employment / Job issues: Denies stressors Family Relationships: Denies Chief Technology Officer / Lack of resources (include bankruptcy): Denies stressors Housing / Lack of housing: Denies stressors Physical health (include injuries & life threatening diseases): Chronic lower back pain from breaking back summer before senior year of high school.  Has nerve damage.  Had an appointment yesterday morning with her pain management doctor.  Is not seeing any improvement in her pain, which is very frustrating fof her as a 23yo woman.  Has already tried so many medicines, chiropractic, etc.  Also is stressed by exercise-induced anaphylaxis which alters how she can approach her day. Social relationships: Denies stressors Substance abuse: Denies stressors Bereavement / Loss: Denies stressors  Living/Environment/Situation:  Living Arrangements: Non-relatives/Friends Living conditions (as described by patient or guardian): Good Who else  lives in the home?: 2 best friends How long has patient lived in current situation?: 1-1/2 years together What is atmosphere in current home: Comfortable, Temporary  Family History:  Marital status: Single Are you sexually active?: No What is your sexual orientation?: Straight Does patient have children?: No  Childhood History:  By whom was/is the patient raised?: Both parents Description of patient's relationship with caregiver when they were a child: Really good relationship with both parents Patient's description of current relationship with people who raised him/her: Really good relationship with both parents, very supportive How were you disciplined when you got in trouble as a child/adolescent?: Time out Does patient have siblings?: Yes Number of Siblings: 1 Description of patient's current relationship with siblings: Sister - best friends Did patient suffer any verbal/emotional/physical/sexual abuse as a child?: No Did patient suffer from severe childhood neglect?: No Has patient ever been sexually abused/assaulted/raped as an adolescent or adult?: No Was the patient ever a victim of a crime or a disaster?: No Witnessed domestic violence?: No Has patient been effected by domestic violence as an adult?: No  Education:  Highest grade of school patient has completed: Holiday representative at [employer] and history Currently a student?: Yes Name of school: [employer] How long has the patient attended?: 3 years Learning disability?: No  Employment/Work Situation:   Employment situation: Consulting civil engineer What is the longest time patient has a held a job?: 4-5 months Where was the patient employed at that time?: retail Did You Receive Any Psychiatric Treatment/Services While in the [employer]?: (No Financial planner) Are There Guns or Other Weapons in Your Home?: No  Financial Resources:   Financial resources:  Support from parents / caregiver, Private insurance Does  patient have a representative payee or guardian?: No  Alcohol/Substance Abuse:   What has been your use of drugs/alcohol within the last 12 months?: Alcohol - glass of wine twice a week Alcohol/Substance Abuse Treatment Hx: Denies past history Has alcohol/substance abuse ever caused legal problems?: No  Social Support System:   Conservation officer, nature Support System: Production assistant, radio System: Parents, sister, roommates, friends Type of faith/religion: Methodist How does patient's faith help to cope with current illness?: Spiritual, not organized Landscape architect, believes in [employer] plan and this is happening for a reason  Leisure/Recreation:   Leisure and Hobbies: Journaling  Strengths/Needs:   What is the patient's perception of their strengths?: Passionate, articulate, determined, hard-working, goal-oriented, plan-oriented, problem solver Patient states they can use these personal strengths during their treatment to contribute to their recovery: Talk to people about how I'm feeling, don't let this stop me from what I need/want to do, try to fix the problem, come up with specific goals and plans. Patient states these barriers may affect/interfere with their treatment: None Patient states these barriers may affect their return to the community: None Other important information patient would like considered in planning for their treatment: None  Discharge Plan:   Currently receiving community mental health services: Yes (From Whom)(Sees Dr. Jannifer Franklin at [facility] and a pain management doctor at [facility], and a therapist [facility]) Patient states concerns and preferences for aftercare planning are: Back to current providers Patient states they will know when they are safe and ready for discharge when: "When I say I'm good now, when I don't want to harm myself, when I am rational." Does patient have access to transportation?: Yes Does  patient have financial barriers related to discharge medications?: No Patient description of barriers related to discharge medications: Parents' financial support and private insurance Plan for living situation after discharge: Stay at home with parents during spring break next week (starting 3 days early), until returns to school. Will patient be returning to same living situation after discharge?: No  Summary/Recommendations:   Summary and Recommendations (to be completed by the evaluator): Patient is a 22yo female [employer] student admitted with suicidal ideation with a plan to walk in front of cars or cut herself.  Primary stressors include recent medication changes after returning from study abroad where she could not get this done, chronic back pain/nerve damage that has had multiple unsuccessful treatment efforts, considerable school pressures.  She drinks a glass of wine about two times a week and denies any drug use.  She has a psychiatrist, pain management doctor, and has just started in therapy.   Patient will benefit from crisis stabilization, medication evaluation, group therapy and psychoeducation, in addition to case management for discharge planning. At discharge it is recommended that Patient adhere to the established discharge plan and continue in treatment.  Lynnell Chad. 12/28/2018

## 2018-12-28 NOTE — Therapy (Signed)
Occupational Therapy Group Note  Date:  12/28/2018 Time:  12:40 PM  Group Topic/Focus:  Stress Management  Participation Level:  Minimal  Participation Quality:  Attentive  Affect:  Flat  Cognitive:  Appropriate  Insight: Improving  Engagement in Group:  Limited  Modes of Intervention:  Activity, Discussion, Education and Socialization  Additional Comments:    S: no subjective offered this date  O: Stress management group completed to use as productive coping strategy, to help mitigate maladaptive coping to integrate in functional BADL/IADL. Stress management tool worksheet discussed to educate on unhealthy vs healthy coping skills to manage stress to improve community integration. Coping strategies taught include: relaxation based- deep breathing, counting to 10, taking a 1 minute vacation, acceptance, stress balls, relaxation audio/video, visual/mental imagery. Positive mental attitude- gratitude, acceptance, cognitive reframing, positive self talk, anger management. Coping skills bingo played with education given on variety of coping skills between bingo calls. Pts encouraged to share experience with various coping skills and share what has worked for them with others. Coloring and relaxation guide handouts given at the end of the session.   A: Pt presents to group with flat affect, guarded and withdrawn this date. Pt did not offer any subjective at this time, but is seen intently following along.  P: OT group will be x1 per week while pt inpatient.  Dalphine Handing, MSOT, OTR/L Behavioral Health OT/ Acute Relief OT PHP Office: [phone]  Dalphine Handing 12/28/2018, 12:40 PM

## 2018-12-28 NOTE — Plan of Care (Signed)
Progress note  D: pt found in the dayroom interacting with her peers. Pt compliant with medication administration. Pt visibly anxious. Pt animated in her affect. Pt denies any physical pain or symptoms. Pt denies si/hi/ah/vh and verbally agrees to approach staff if these become apparent or before harming herself/others while at [facility]. Pt provided urine cup for urine sample.  A: pt provided support and encouragement. Pt given medication per protocol and standing orders. Q72m safety checks implemented and continued.  R: pt safe on the unit. Will continue to monitor.   Pt progressing in the following metrics  Problem: Education: Goal: Knowledge of [place] [facility] Education information/materials will improve Outcome: Progressing Goal: Emotional status will improve Outcome: Progressing Goal: Mental status will improve Outcome: Progressing Goal: Verbalization of understanding the information provided will improve Outcome: Progressing
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
## 2018-12-28 NOTE — [facility] Suicide Risk Assessment (Signed)
[facility] INPATIENT:  Family/Significant Other Suicide Prevention Education  Suicide Prevention Education:  Education Completed;  mother Felicia Ali [phone],  (name of family member/significant other) has been identified by the patient as the family member/significant other with whom the patient will be residing, and identified as the person(s) who will aid the patient in the event of a mental health crisis (suicidal ideations/suicide attempt).  With written consent from the patient, the family member/significant other has been provided the following suicide prevention education, prior to the and/or following the discharge of the patient.  CSW spent 38 minutes on phone with mother.    Mother states that patient has been proactively seeking help with medication changes since returning from studying abroad in [place].  She is very goal-oriented.  Her mood and thought changes happen abruptly, which alarms mother.  "We haven't reached that stable point with appropriate medicines and therapy."  They have already gone through [facility] system, through [facility] system, and now are ready to go to [facility] system for help with her pain management.    "What can we do to get her to a stable state where if there are extreme stressors, it does not trigger this type of response."  Mother believes most of pt's depression was induced by her back pain and exercise-induced anaphylaxis.  She thinks that pt feels that people think she is making up her medical issues and pain.   The suicide prevention education provided includes the following:  Suicide risk factors  Suicide prevention and interventions  National Suicide Hotline telephone number  [facility] assessment telephone number  [facility] Emergency Assistance 911  [facility] and/or Residential Mobile Crisis Unit telephone number  Request made of family/significant other to:  Remove weapons (e.g., guns, rifles, knives), all items  previously/currently identified as safety concern.    Remove drugs/medications (over-the-counter, prescriptions, illicit drugs), all items previously/currently identified as a safety concern.  The family member/significant other verbalizes understanding of the suicide prevention education information provided.  The family member/significant other agrees to remove the items of safety concern listed above.  Felicia Ali 12/28/2018, 10:09 AM
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
## 2018-12-29 ENCOUNTER — Other Ambulatory Visit: Payer: Self-pay

## 2018-12-29 LAB — URINALYSIS, ROUTINE W REFLEX MICROSCOPIC
BACTERIA UA: NONE SEEN
Bilirubin Urine: NEGATIVE
Glucose, UA: NEGATIVE mg/dL
KETONES UR: NEGATIVE mg/dL
Leukocytes,Ua: NEGATIVE
Nitrite: NEGATIVE
Protein, ur: NEGATIVE mg/dL
Specific Gravity, Urine: 1.011 (ref 1.005–1.030)
pH: 6 (ref 5.0–8.0)

## 2018-12-29 LAB — RAPID URINE DRUG SCREEN, HOSP PERFORMED
Amphetamines: NOT DETECTED
Barbiturates: NOT DETECTED
Benzodiazepines: NOT DETECTED
Cocaine: NOT DETECTED
Opiates: NOT DETECTED
Tetrahydrocannabinol: NOT DETECTED

## 2018-12-29 LAB — PREGNANCY, URINE: Preg Test, Ur: NEGATIVE

## 2018-12-29 NOTE — Progress Notes (Signed)
Pt attended wrap-up group tonight. Pt appears animated/anxious in affect and mood. Pt denies SI/HI/AVH/Pain at this time. Pt was appropriate with interaction. No new c/o's. Trazodone requested and given. Support proivded. Will continue with POC.

## 2018-12-29 NOTE — B[facility] Group Notes (Signed)
[facility] Mental Health Association Group Therapy      12/29/2018 2:00 PM  Type of Therapy: Mental Health Association Presentation  Participation Level: Active  Participation Quality: Attentive  Affect: Appropriate  Cognitive: Oriented  Insight: Developing/Improving  Engagement in Therapy: Engaged  Modes of Intervention: Discussion, Education and Socialization  Summary of Progress/Problems: Mental Health Association (MHA) Speaker came to talk about his personal journey with mental health. The pt processed ways by which to relate to the speaker. MHA speaker provided handouts and educational information pertaining to groups and services offered by the [facility]. Pt was engaged in speaker's presentation and was receptive to resources provided.    Alcario Drought Clinical Social Worker

## 2018-12-29 NOTE — Progress Notes (Signed)
DAR Note: Pt visible in milieu for majority of this shift. Observed in dayroom on initial contact. A & O X4. Denies SI, HI and AVH. Rates her back pain 4/10 after medications. Rates her depression 2/10, anxiety and hopelessness both 0/10 on self inventory sheet. Reports she slept well last night with good appetite, normal energy and good concentration level. Attended groups as scheduled and was engaged in activities and discussions.  Emotional support and reassurance provided as needed. Scheduled medications given per provider's order with verbal education and effects monitored. Safety checks maintained without incident.  Pt receptive to care. Cooperative with unit routines. Compliant with medications when offered. Denies side effects.Tolerated all PO intake well. Remains safe on and off unit.

## 2018-12-29 NOTE — Plan of Care (Signed)
D: Patient is in the dayroom interacting on approach. Patient is alert, oriented, pleasant, and cooperative. Denies SI, HI, AVH, and verbally contracts for safety. Patient reports chronic back pain that she rates "my normal 4/10" and feels it is being controlled with medication. Patient denies any other physical symptoms/pain. Rates her day 8/10 and reports she made some friends on the unit.    A: Scheduled medications administered per MD order. Support provided. Patient educated on safety on the unit and medications. Routine safety checks every 15 minutes. Patient stated understanding to tell nurse about any new physical symptoms. Patient understands to tell staff of any needs.     R: No adverse drug reactions noted. Patient verbally contracts for safety. Patient remains safe at this time and will continue to monitor.   Problem: Education: Goal: Knowledge of [facility] Education information/materials will improve Outcome: Progressing Goal: Mental status will improve Outcome: Progressing   Problem: Safety: Goal: Periods of time without injury will increase Outcome: Progressing   Patient is oriented to the unit. Patient denies SI, HI, AVH, and contracts for safety. Patient remains safe and will continue to monitor.
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
## 2018-12-30 MED ORDER — HYDROXYZINE HCL 25 MG PO TABS: 25.0000 mg | ORAL_TABLET | Freq: Every day | ORAL | 0 refills | Status: DC

## 2018-12-30 MED ORDER — ACETAMINOPHEN 325 MG PO TABS: 650.0000 mg | ORAL_TABLET | Freq: Four times a day (QID) | ORAL | Status: DC | PRN

## 2018-12-30 MED ORDER — DULOXETINE HCL 60 MG PO CPEP: 60.0000 mg | ORAL_CAPSULE | Freq: Every day | ORAL | 0 refills | Status: DC

## 2018-12-30 MED ORDER — TRAZODONE HCL 100 MG PO TABS: 100.0000 mg | ORAL_TABLET | Freq: Every evening | ORAL | 0 refills | Status: AC | PRN
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
## 2018-12-30 NOTE — Progress Notes (Signed)
Pt received both written and verbal discharge instructions. Pt verbalized understanding of discharge instructions. Pt agreed to f/u appts and med regimen. Pt received SRA, AVS, transitional record and a suicide prevention worksheet. Pt gathered belongings from room and locker. Pt safely discharged to the lobby and picked up by her mother.
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
## 2018-12-30 NOTE — Progress Notes (Signed)
  [facility] Adult Case Management Discharge Plan :  Will you be returning to the same living situation after discharge:  Yes,  patient reports she is returning home with her parents  At discharge, do you have transportation home?: Yes,  patient's mother is picking her up at discharge Do you have the ability to pay for your medications: Yes,  BCBS, support from parents  Release of information consent forms completed and in the chart;  Patient's signature needed at discharge.  Patient to Follow up at: Follow-up Information    [facility] Follow up on 01/19/2019.   Why:  Your next appointment with Dr. Jannifer Franklin is at 9:15AM on 01/19/19.  This was moved up from your original appointment due to your hospitalization.  Please take your discharge summary for Dr. Jannifer Franklin to review.  Thank you. Contact information: [address] [place] [address] [phone]        [facility] Follow up on 01/06/2019.   Why:  Your next appointment with therapist is scheduled for Friday, 01/06/2019, at 10:30am. Contact information: [address] [place] [address] [phone]       [place] Follow up.   Why:  Your next appointments with Dr. Eustaquio Maize (anesthesiologist) and Dr. Jethro Bolus (psychologist) must be scheduled by you.  Please call as soon as you leave the hospital to schedule first available time. Contact information: [address], [address] [place] [address] [phone]           Next level of care provider has access to [facility] Link:yes  Safety Planning and Suicide Prevention discussed: Yes,  with the patient's mother  Have you used any form of tobacco in the last 30 days? (Cigarettes, Smokeless Tobacco, Cigars, and/or Pipes): No  Has patient been referred to the Quitline?: N/A patient is not a smoker  Patient has been referred for addiction  treatment: N/A  Maeola Sarah, LCSWA 12/30/2018, 11:36 AM

## 2018-12-30 NOTE — Progress Notes (Signed)
Adult Psychoeducational Group Note  Date:  12/30/2018 Time:  4:11 AM  Group Topic/Focus:  Wrap-Up Group:   The focus of this group is to help patients review their daily goal of treatment and discuss progress on daily workbooks.  Participation Level:  Active  Participation Quality:  Appropriate and Attentive  Affect:  Appropriate  Cognitive:  Alert and Appropriate  Insight: Appropriate and Good  Engagement in Group:  Engaged  Modes of Intervention:  Discussion  Additional Comments:  Pt said she can not rate her day. There no reason to rate her day. The one positive thing that happened she meet 2 new friends  Chauncey Fischer 12/30/2018, 4:11 AM

## 2020-08-01 ENCOUNTER — Other Ambulatory Visit (HOSPITAL_COMMUNITY): Payer: Self-pay

## 2020-08-15 MED FILL — WEGOVY 0.5 MG/0.5ML SOAJ: 0.5 | 28 days supply | Qty: 2 | Fill #0

## 2020-09-13 ENCOUNTER — Other Ambulatory Visit: Payer: Self-pay

## 2020-09-13 ENCOUNTER — Ambulatory Visit (HOSPITAL_COMMUNITY)
Admission: EM | Admit: 2020-09-13 | Discharge: 2020-09-13 | Disposition: A | Discharge status: Home / Self Care | Payer: BC Managed Care – PPO | Attending: Psychiatry | Admitting: Psychiatry

## 2020-09-13 ENCOUNTER — Other Ambulatory Visit (HOSPITAL_COMMUNITY): Payer: Self-pay

## 2020-09-13 ENCOUNTER — Encounter (HOSPITAL_COMMUNITY): Payer: Self-pay | Admitting: Emergency Medicine

## 2020-09-13 DIAGNOSIS — F3181 Bipolar II disorder: Secondary | ICD-10-CM

## 2020-09-13 MED ORDER — LATUDA 20 MG PO TABS: 20.0000 mg | ORAL_TABLET | Freq: Every day | ORAL | 0 refills | Status: AC

## 2020-09-13 NOTE — ED Provider Notes (Addendum)
[facility] Medical Screening Exam  Patient Name: Felicia Ali MRN: [id number] Date of Evaluation: 09/13/20 Chief Complaint:   Diagnosis:  Final diagnoses:  Bipolar II disorder (HCC)    History of Present illness: Felicia Ali is a 24 y.o. female.  Patient presents to the [facility] voluntarily as a walk-in with her mother, father, and sister.  She reports that she has been feeling a little more depressed over the last week and that this morning she started having some suicidal ideations with thoughts of overdose on her trazodone. She reports that she has been dealing with depression and bipolar disorder for a while now and has been known a few different medications.  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  She states that she does have a stressful job and sometimes it can be a trigger and she does get concern for relapse sometimes. She reports that she follows up with Dr. Jannifer Franklin.  She states that currently she is taking Cymbalta 60 mg p.o. daily, Abilify 2.5 mg p.o. daily, and trazodone 100 mg p.o. nightly.  She reports that she was on Abilify 5 mg but that it was a little too strong and made her blunted.  She reports that she has been on Cymbalta by itself in the past and stated that her mood swings were more irregular.  She states that she is currently not feeling suicidal but just has thoughts of better off being dead.  She states she is not interested in being admitted inpatient.  She reports that she feels safe to discharge home with her family.  After discussing medications she has concerns for weight gain with various medications but wants to be stable.  Discussed with the patient about starting Latuda instead of Abilify and she stated understanding and agreement.  Will prescribe Latuda 20 mg p.o. daily with supper and discontinue the Abilify.  Medication was E prescribed to pharmacy of choice.  Patient also reports that she was trying to  contact Dr. Gloris Manchester office to get a sooner appointment but she cannot be seen until January.  After offering PHP program the patient is interested in this and her information will be referred to them, but she is also informed that she may not hear from them until Monday due to it being late on Friday. Spoke to patient parents and sister in the lobby with patient's consent.  They report that they do feel safe with the patient coming home and they do not have any concerns for safety at this time.  They report that they plan to lock up all of her medications and that someone will be home with her around-the-clock.  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  She states that she is concerned about losing her job because of having to be out due to mental instability.  They agreed that the patient should try different medication because she has not had great effects from the Abilify.  Patient sister is a Teacher, early years/pre and we discussed medications.  They stated understanding and agreement.  They state that they feel safe with discharging patient home.  Psychiatric Specialty Exam  Presentation  General Appearance:Appropriate for Environment;Casual;Well Groomed  Eye Contact:Good  Speech:Clear and Coherent;Normal Rate  Speech Volume:Normal  Handedness:Right   Mood and Affect  Mood:Depressed  Affect:Appropriate;Congruent;Depressed   Thought Process  Thought Processes:Coherent  Descriptions of Associations:Intact  Orientation:Full (Time,  Place and Person)  Thought Content:WDL  Hallucinations:None  Ideas of Reference:None  Suicidal Thoughts:No  Homicidal Thoughts:No   Sensorium  Memory:Immediate Good;Recent Good;Remote Good  Judgment:Good  Insight:Good   Executive Functions  Concentration:Good  Attention Span:Good  Recall:Good  Fund of Knowledge:Good  Language:Good   Psychomotor Activity  Psychomotor  Activity:Normal   Assets  Assets:Communication Skills;Desire for Improvement;Financial Resources/Insurance;Housing;Physical Health;Social Support;Transportation;Vocational/Educational   Sleep  Sleep:Good  Number of hours: No data recorded  Physical Exam: Physical Exam Vitals and nursing note reviewed.  Constitutional:      Appearance: She is well-developed.  HENT:     Head: Normocephalic.  Eyes:     Pupils: Pupils are equal, round, and reactive to light.  Cardiovascular:     Rate and Rhythm: Normal rate.  Pulmonary:     Effort: Pulmonary effort is normal.  Musculoskeletal:        General: Normal range of motion.  Neurological:     Mental Status: She is alert and oriented to person, place, and time.  Psychiatric:        Mood and Affect: Mood is depressed. Affect is tearful.    Review of Systems  Constitutional: Negative.   HENT: Negative.   Eyes: Negative.   Respiratory: Negative.   Cardiovascular: Negative.   Gastrointestinal: Negative.   Genitourinary: Negative.   Musculoskeletal: Negative.   Skin: Negative.   Neurological: Negative.   Endo/Heme/Allergies: Negative.   Psychiatric/Behavioral: Positive for depression. The patient is nervous/anxious.    Blood pressure (!) 142/92, pulse (!) 105, temperature (!) 97.5 F (36.4 C), temperature source Tympanic, resp. rate 18, SpO2 100 %. There is no height or weight on file to calculate BMI.  Musculoskeletal: Strength & Muscle Tone: within normal limits Gait & Station: normal Patient leans: N/A   [facility] MSE Discharge Disposition for Follow up and Recommendations: Based on my evaluation the patient does not appear to have an emergency medical condition and can be discharged with resources and follow up care in outpatient services for Medication Management, Partial Hospitalization Program and Individual Therapy   WELDA AZZARELLO, FNP 09/13/2020, 2:11 PM

## 2020-09-13 NOTE — BH Assessment (Signed)
Comprehensive Clinical Assessment (CCA) Note  09/13/2020 Felicia Ali [id number]   Patient is a 24 year old female presenting voluntarily to [facility] for assessment of increased depression and SI x 1 week. Patient unable to identify a trigger. Patient reports she is diagnosed with Bipolar II and is followed by Dr. Jannifer Franklin and a therapist at a pain management clinic. She attempted to set up an appointment for both they could not see her soon. She reports earlier today she had SI with a plan to overdose on Trazedone. She disclosed this plan to family and they are removing medications. She states she currently does not feel suicidal. Patient denies HI/VAH, substance use, or trauma history. Patient BIB her parents, Amy and Vilma Prader, who wait in the lobby. Patient gives verbal consent for TTS to speak with them for collateral.  Per collateral: Patient with history of mood instability. They suggested she come in today because she could not get in with her regular psychiatrist. They believe patient's Abilify could possibly causing this depressive episode. Parents do not feel she is a danger to herself or others. Her father is retired and her mother is off next week and provide 24/7 supervision.  Chief Complaint:  Chief Complaint  Patient presents with  . Suicidal   Visit Diagnosis: Bipolar II  Per Reola Calkins, PMHNP this patient does not meet in patient care criteria. Patient referred to [facility] PHP.   CCA Screening, Triage and Referral (STR)  Patient Reported Information How did you hear about [place]? Self  Referral name: No data recorded Referral phone number: No data recorded  Whom do you see for routine medical problems? Primary Care  Practice/Facility Name: [facility]  Practice/Facility Phone Number: [phone]  Name of Contact: No data recorded Contact Number: No data recorded Contact Fax Number: No data recorded Prescriber Name: No data recorded Prescriber Address (if known): No data  recorded  What Is the Reason for Your Visit/Call Today? suicidal  How Long Has This Been Causing You Problems? 1 wk - 1 month  What Do You Feel Would Help You the Most Today? No data recorded  Have You Recently Been in Any Inpatient Treatment (Hospital/Detox/Crisis Center/28-Day Program)? No  Name/Location of Program/Hospital:No data recorded How Long Were You There? No data recorded When Were You Discharged? No data recorded  Have You Ever Received Services From [facility] Before? Yes  Who Do You See at [facility]? [facility]   Have You Recently Had Any Thoughts About Hurting Yourself? Yes  Are You Planning to Commit Suicide/Harm Yourself At This time? Yes   Have you Recently Had Thoughts About Hurting Someone Karolee Ohs? No  Explanation: No data recorded  Have You Used Any Alcohol or Drugs in the Past 24 Hours? No  How Long Ago Did You Use Drugs or Alcohol? No data recorded What Did You Use and How Much? No data recorded  Do You Currently Have a Therapist/Psychiatrist? Yes  Name of Therapist/Psychiatrist: Dr. Jannifer Franklin; Dr. Eliott Nine   Have You Been Recently Discharged From Any Office Practice or Programs? No  Explanation of Discharge From Practice/Program: No data recorded    CCA Screening Triage Referral Assessment Type of Contact: Face-to-Face  Is this Initial or Reassessment? No data recorded Date Telepsych consult ordered in [facility]:  No data recorded Time Telepsych consult ordered in [facility]:  No data recorded  Patient Reported Information Reviewed? Yes  Patient Left Without Being Seen? No data recorded Reason for Not Completing Assessment: No data recorded  Collateral Involvement: family- Amy and Vilma Prader   Does Patient Have a Automotive engineer Guardian? No data recorded Name and Contact of Legal Guardian: No data recorded If Minor and Not Living with Parent(s), Who has Custody? No data recorded Is CPS involved or ever been involved? No data recorded Is APS  involved or ever been involved? No data recorded  Patient Determined To Be At Risk for Harm To Self or Others Based on Review of Patient Reported Information or Presenting Complaint? No data recorded Method: No data recorded Availability of Means: No data recorded Intent: No data recorded Notification Required: No data recorded Additional Information for Danger to Others Potential: No data recorded Additional Comments for Danger to Others Potential: No data recorded Are There Guns or Other Weapons in Your Home? No data recorded Types of Guns/Weapons: No data recorded Are These Weapons Safely Secured?                            No data recorded Who Could Verify You Are Able To Have These Secured: No data recorded Do You Have any Outstanding Charges, Pending Court Dates, Parole/Probation? No data recorded Contacted To Inform of Risk of Harm To Self or Others: No data recorded  Location of Assessment: No data recorded  Does Patient Present under Involuntary Commitment? No data recorded IVC Papers Initial File Date: No data recorded  [place] of Residence: No data recorded  Patient Currently Receiving the Following Services: No data recorded  Determination of Need: No data recorded  Options For Referral: No data recorded    CCA Biopsychosocial Intake/Chief Complaint:  NA  Current Symptoms/Problems: NA   Patient Reported Schizophrenia/Schizoaffective Diagnosis in Past: No   Strengths: NA  Preferences: NA  Abilities: NA   Type of Services Patient Feels are Needed: NA   Initial Clinical Notes/Concerns: NA   Mental Health Symptoms Depression:  Change in energy/activity;Difficulty Concentrating;Hopelessness;Fatigue;Irritability;Increase/decrease in appetite;Sleep (too much or little);Tearfulness;Weight gain/loss;Worthlessness   Duration of Depressive symptoms: Greater than two weeks   Mania:  None   Anxiety:   None   Psychosis:  None   Duration of Psychotic  symptoms: No data recorded  Trauma:  None   Obsessions:  None   Compulsions:  None   Inattention:  None   Hyperactivity/Impulsivity:  N/A   Oppositional/Defiant Behaviors:  N/A   Emotional Irregularity:  N/A   Other Mood/Personality Symptoms:  No data recorded   Mental Status Exam Appearance and self-care  Stature:  Average   Weight:  Average weight   Clothing:  Casual   Grooming:  Well-groomed   Cosmetic use:  None   Posture/gait:  Normal   Motor activity:  Not Remarkable   Sensorium  Attention:  Normal   Concentration:  Normal   Orientation:  X5   Recall/memory:  Normal   Affect and Mood  Affect:  Depressed   Mood:  Depressed   Relating  Eye contact:  Normal   Facial expression:  Depressed   Attitude toward examiner:  Cooperative   Thought and Language  Speech flow: Clear and Coherent   Thought content:  Appropriate to Mood and Circumstances   Preoccupation:  None   Hallucinations:  None   Organization:  No data recorded  [employer] of Knowledge:  Fair   Intelligence:  Average   Abstraction:  Normal   Judgement:  Fair   Dance movement psychotherapist:  Realistic   Insight:  Fair  Decision Making:  Normal   Social Functioning  Social Maturity:  Isolates   Social Judgement:  Normal   Stress  Stressors:  Work   Coping Ability:  Human resources officer Deficits:  Scientist, physiological;Interpersonal;Responsibility   Supports:  Friends/Service system;Family     Religion: Religion/Spirituality Are You A Religious Person?: No  Leisure/Recreation: Leisure / Recreation Do You Have Hobbies?: Yes Leisure and Hobbies: crotcheting  Exercise/Diet: Exercise/Diet Do You Exercise?: No Have You Gained or Lost A Significant Amount of Weight in the Past Six Months?: No Do You Follow a Special Diet?: No Do You Have Any Trouble Sleeping?: Yes Explanation of Sleeping Difficulties: reports too much sleep   CCA  Employment/Education Employment/Work Situation: Employment / Work Situation Employment situation: Employed Where is patient currently employed?: [employer] How long has patient been employed?: 1 year Patient's job has been impacted by current illness: No What is the longest time patient has a held a job?: [employer] Where was the patient employed at that time?: [employer] Has patient ever been in the [employer]?: No  Education: Education Is Patient Currently Attending School?: No Last Grade Completed: 12 Name of High School: [employer] Did Garment/textile technologist From [employer]?: Yes Did Theme park manager?: Yes What Type of College Degree Do you Have?: [employer] Did [place] Attend Graduate School?: No Did You Have An Individualized Education Program (IIEP): No Did You Have Any Difficulty At School?: No Patient's Education Has Been Impacted by Current Illness: No   CCA Family/Childhood History Family and Relationship History: Family history Marital status: Single Are you sexually active?: No What is your sexual orientation?: Straight Does patient have children?: No  Childhood History:  Childhood History By whom was/is the patient raised?: Both parents Description of patient's relationship with caregiver when they were a child: Really good relationship with both parents How were you disciplined when you got in trouble as a child/adolescent?: Time out Did patient suffer any verbal/emotional/physical/sexual abuse as a child?: No Has patient ever been sexually abused/assaulted/raped as an adolescent or adult?: No Witnessed domestic violence?: No Has patient been affected by domestic violence as an adult?: No  Child/Adolescent Assessment:     CCA Substance Use Alcohol/Drug Use: Alcohol / Drug Use Pain Medications: Please see MAR Prescriptions: Please see MAR Over the Counter: Please see MAR History of alcohol / drug use?: Yes Longest period of sobriety (when/how long): Unknown                          ASAM's:  Six Dimensions of Multidimensional Assessment  Dimension 1:  Acute Intoxication and/or Withdrawal Potential:      Dimension 2:  Biomedical Conditions and Complications:      Dimension 3:  Emotional, Behavioral, or Cognitive Conditions and Complications:     Dimension 4:  Readiness to Change:     Dimension 5:  Relapse, Continued use, or Continued Problem Potential:     Dimension 6:  Recovery/Living Environment:     ASAM Severity Score:    ASAM Recommended Level of Treatment:     Substance use Disorder (SUD)    Recommendations for Services/Supports/Treatments:    DSM5 Diagnoses: Patient Active Problem List   Diagnosis Date Noted  . MDD (major depressive disorder), severe (HCC) 12/28/2018    Patient Centered Plan: Patient is on the following Treatment Plan(s):     Referrals to Alternative Service(s): Referred to Alternative Service(s):   Place:   Date:   Time:  Referred to Alternative Service(s):   Place:   Date:   Time:    Referred to Alternative Service(s):   Place:   Date:   Time:    Referred to Alternative Service(s):   Place:   Date:   Time:     Orvis Brill, LCSW

## 2020-09-13 NOTE — Discharge Instructions (Addendum)
Follow up with [facility] for PHP. Discontinue use of Abilify Patient is instructed prior to discharge to: Take all medications as prescribed by his/her mental healthcare provider. Report any adverse effects and or reactions from the medicines to his/her outpatient provider promptly. Patient has been instructed & cautioned: To not engage in alcohol and or illegal drug use while on prescription medicines. In the event of worsening symptoms, patient is instructed to call the crisis hotline, 911 and or go to the nearest ED for appropriate evaluation and treatment of symptoms. To follow-up with his/her primary care provider for your other medical issues, concerns and or health care needs.

## 2020-09-13 NOTE — ED Triage Notes (Signed)
Patient states she is having SI thoughts for about a week. Patient states her plan is to overdose. Patient states that her thoughts got worst over last 24 hours. Patient states she is Bipolar 2. Patient states she contacted her psychiatrist but could not get in until January. Patient denies HI and A/V/H. Patient unable to identify triggers.

## 2020-09-17 ENCOUNTER — Telehealth (HOSPITAL_COMMUNITY): Payer: Self-pay | Admitting: Licensed Clinical Social Worker

## 2020-09-20 ENCOUNTER — Telehealth (HOSPITAL_COMMUNITY): Payer: Self-pay | Admitting: Internal Medicine

## 2020-09-20 NOTE — Telephone Encounter (Signed)
Care Management - Follow Up [facility] Discharges   Writer left a HIPPA compliant voice mail regarding her referral to the PHP program.   Writer left name and phone number for the patent to call back if further assistance is needed in scheduling a follow up appointment with an outpatient provider.     

## 2020-09-26 MED FILL — WEGOVY 1 MG/0.5ML SOAJ: 1 | 28 days supply | Qty: 2 | Fill #0

## 2020-10-14 ENCOUNTER — Telehealth (HOSPITAL_COMMUNITY): Payer: Self-pay | Admitting: *Deleted

## 2020-10-14 NOTE — Telephone Encounter (Signed)
Patient walked in stating Feliz Beam NP in [facility] sent her up to get samples of Latuda to last till her appt with Dr Jannifer Franklin in mid jan. She is [employer] and [place] has not been able to get it approved thru her insurance. We do not have a month of samples. Called him back and pharmacy downstairs can provide a month for her today and she will follow up with her own provider in Jan.

## 2020-10-21 MED FILL — WEGOVY 1.7 MG/0.75ML SOAJ: 1.7 | 28 days supply | Qty: 3 | Fill #0

## 2021-06-05 ENCOUNTER — Ambulatory Visit
Admission: RE | Admit: 2021-06-05 | Discharge: 2021-06-05 | Disposition: A | Discharge status: Home / Self Care | Payer: BC Managed Care – PPO | Source: Ambulatory Visit | Attending: Internal Medicine | Admitting: Internal Medicine

## 2021-06-05 ENCOUNTER — Other Ambulatory Visit: Payer: Self-pay | Admitting: Internal Medicine

## 2021-06-05 DIAGNOSIS — M545 Low back pain, unspecified: Secondary | ICD-10-CM

## 2021-06-05 MED ORDER — GADOBENATE DIMEGLUMINE 529 MG/ML IV SOLN
20.0000 mL | Freq: Once | INTRAVENOUS | Status: AC | PRN
  Administered 2021-06-05: 20 mL via INTRAVENOUS

## 2022-06-11 IMAGING — MR MR LUMBAR SPINE WO/W CM
4 of 7 series · 24 of 48 positions shown · IV contrast (multihance)
Comparison: None.

CLINICAL DATA: Low back pain for 2 and half weeks.

EXAM:
MRI LUMBAR SPINE WITHOUT AND WITH CONTRAST
TECHNIQUE: Multiplanar and multiecho pulse sequences of the lumbar spine were
obtained without and with intravenous contrast.
CONTRAST:  20mL MULTIHANCE GADOBENATE DIMEGLUMINE 529 MG/ML IV SOLN

[Series 3: T1 · sagittal · 4.0mm · 0.88mm/px · 3 of 14 slices shown (1 of 2)]
[im 1/14]
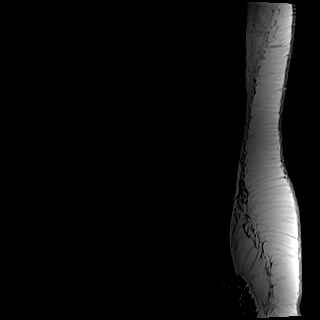
[im 7/14]
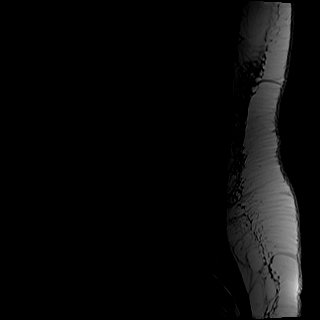
[im 14/14]
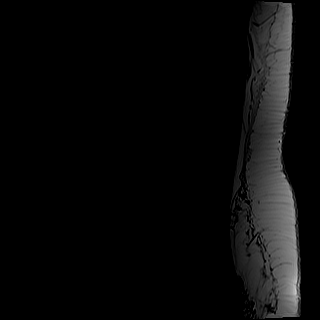

[Series 5: T2 · axial · 4.0mm · 0.39mm/px · z∈[-14,+203]mm · 11 of 42 slices shown (1 of 2)]
[im 1/42]
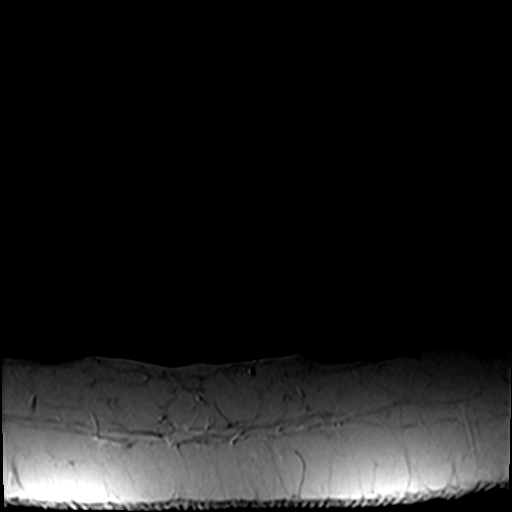
[im 5/42]
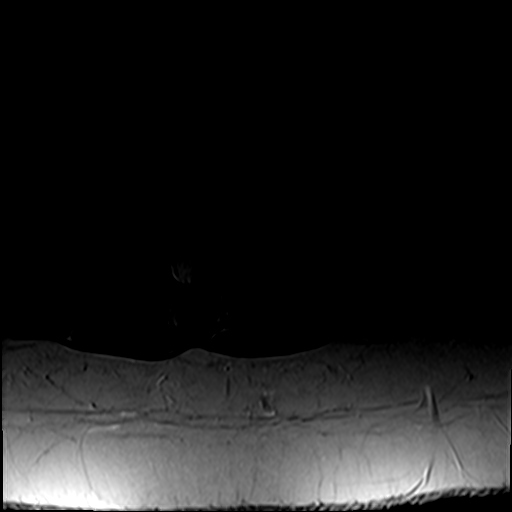
[im 9/42]
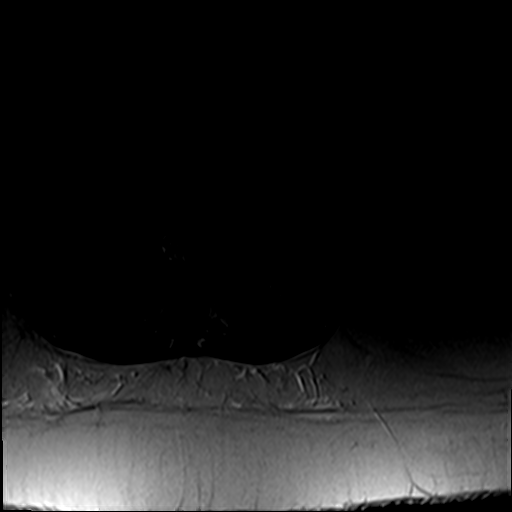
[im 13/42]
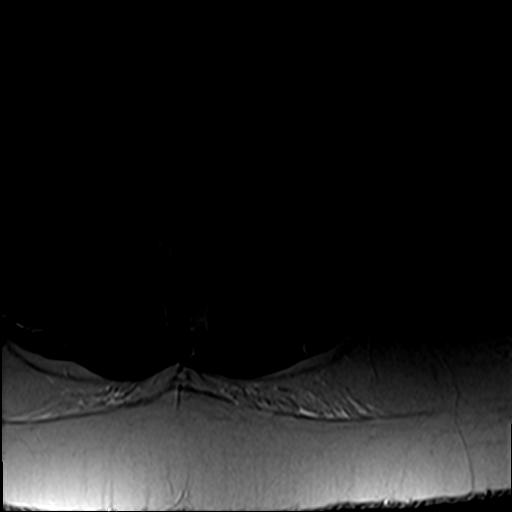
[im 17/42]
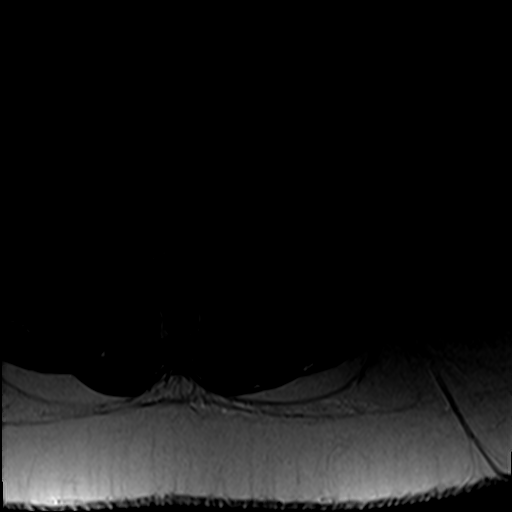
[im 21/42]
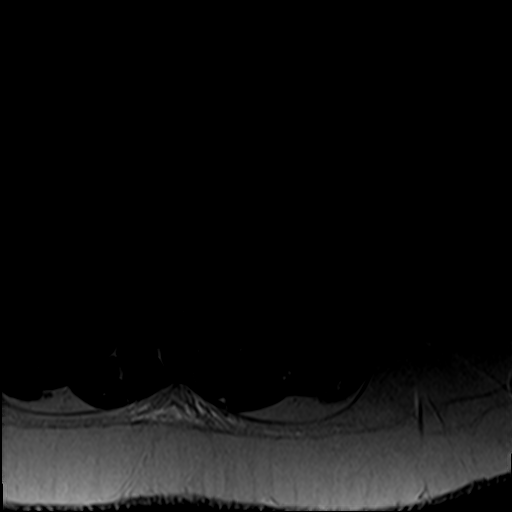
[im 25/42]
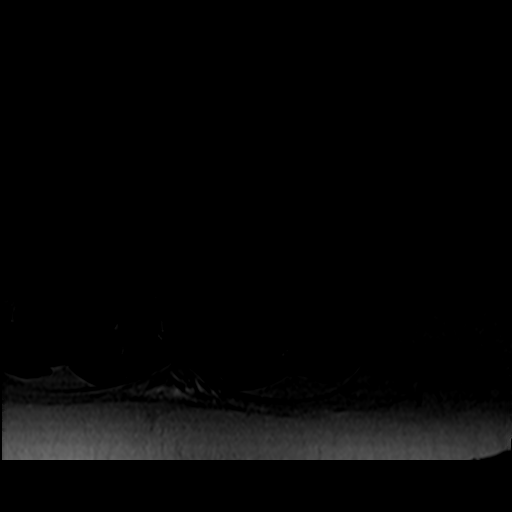
[im 29/42]
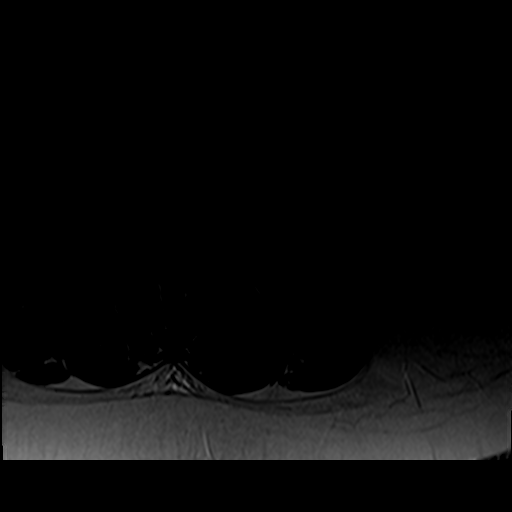
[im 33/42]
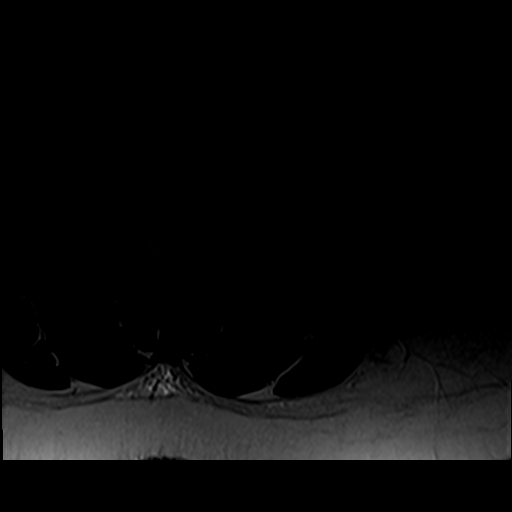
[im 37/42]
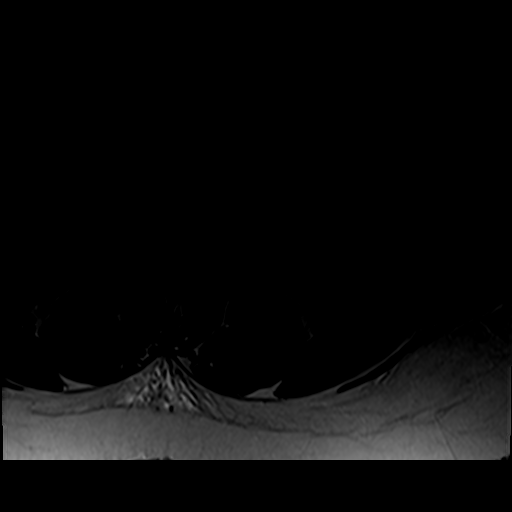
[im 42/42]
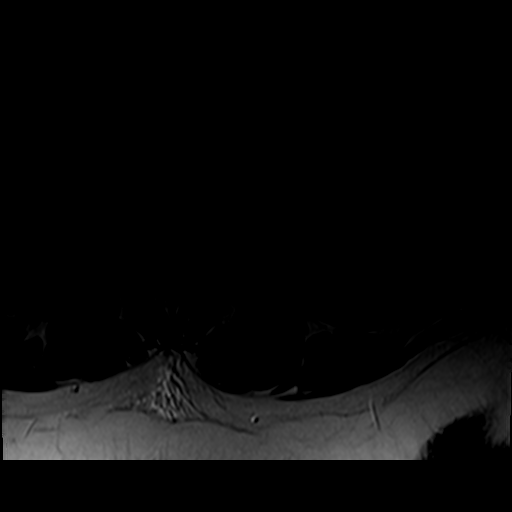

[Series 6: T1 · axial · 4.0mm · 0.39mm/px · z∈[-14,+179]mm · 6 of 42 slices shown (2 of 2)]
[im 1/42]
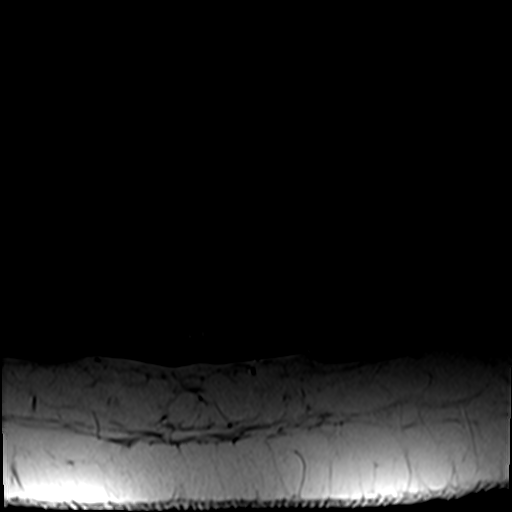
[im 5/42]
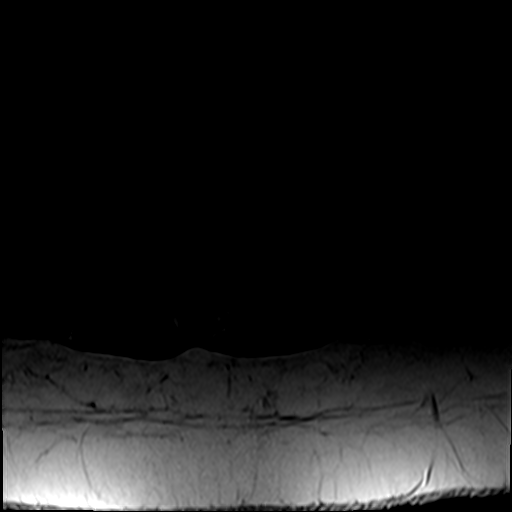
[im 9/42]
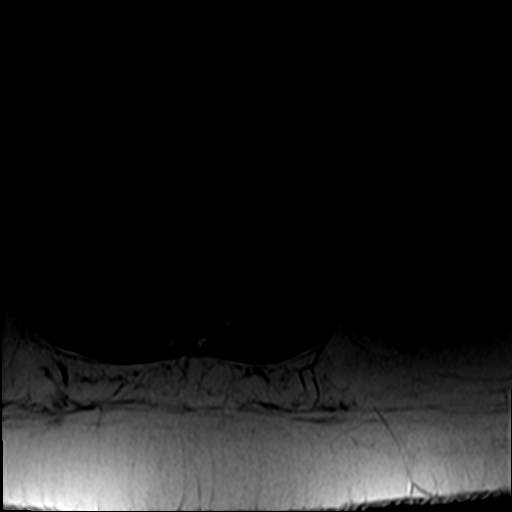
[im 13/42]
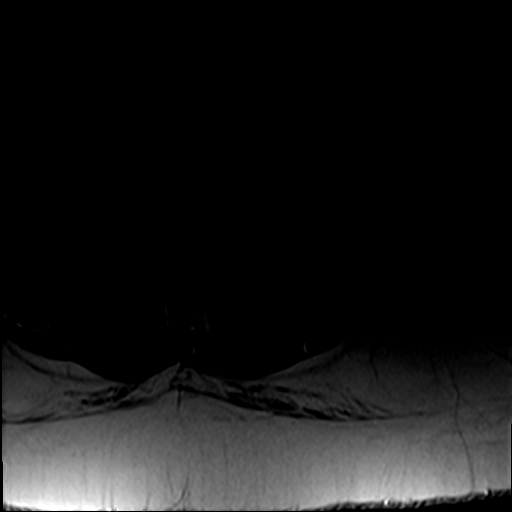
[im 21/42]
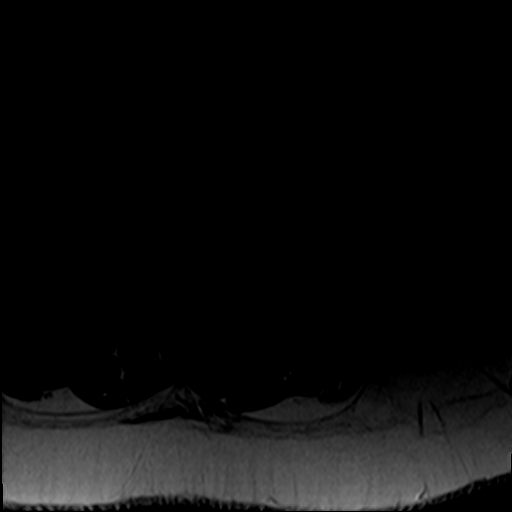
[im 37/42]
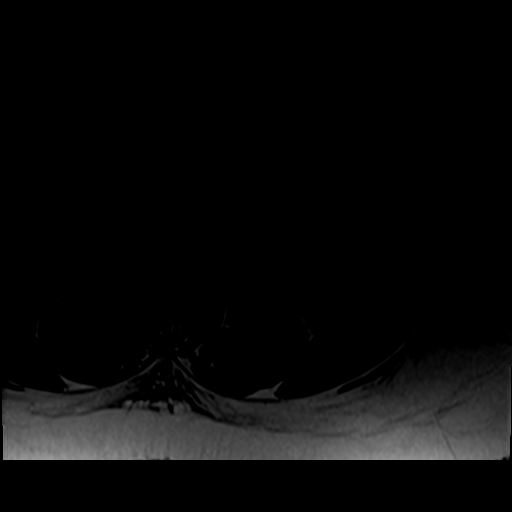

[Series 9: T2 · sagittal · 4.0mm · 1.09mm/px · 4 of 14 slices shown (2 of 2)]
[im 1/14]
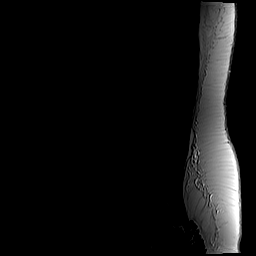
[im 5/14]
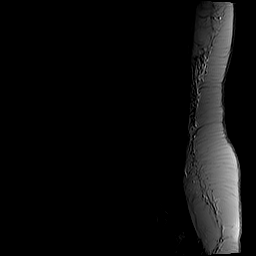
[im 9/14]
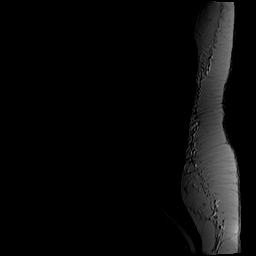
[im 14/14]
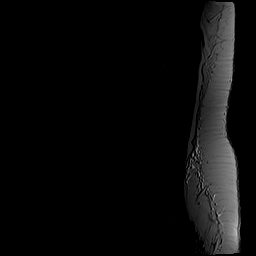

[24 of 48 positions shown; findings below may reference images not displayed]

FINDINGS: Segmentation: There are five lumbar type vertebral bodies. The last
full intervertebral disc space is labeled L5-S1.

Alignment:  Normal

Vertebrae: Normal marrow signal. No bone lesions or fractures. The
facets are normally aligned. No pars defects.

Conus medullaris and cauda equina: Conus extends to the L1 level.
Conus and cauda equina appear normal.

Paraspinal and other soft tissues: No significant paraspinal or
retroperitoneal findings.

Disc levels:

No lumbar disc protrusions, spinal or foraminal stenosis.
IMPRESSION: Unremarkable lumbar spine MRI examination.

## 2022-10-05 ENCOUNTER — Other Ambulatory Visit (HOSPITAL_COMMUNITY): Payer: Self-pay

## 2022-10-05 MED ORDER — PREGABALIN 300 MG PO CAPS
300.0000 mg | ORAL_CAPSULE | Freq: Two times a day (BID) | ORAL | 1 refills | Status: DC
  Filled 2022-10-05: qty 180, 90d supply, fill #0
  Filled 2023-01-18: qty 180, fill #0
  Filled 2023-02-14 – 2023-03-28 (×2): qty 180, 90d supply, fill #0

## 2022-10-07 ENCOUNTER — Other Ambulatory Visit (HOSPITAL_COMMUNITY): Payer: Self-pay

## 2022-10-07 MED ORDER — METHOCARBAMOL 500 MG PO TABS
ORAL_TABLET | ORAL | 2 refills | Status: AC
  Filled 2022-10-07: qty 240, 30d supply, fill #0

## 2022-10-07 MED ORDER — KETOROLAC TROMETHAMINE 10 MG PO TABS
ORAL_TABLET | ORAL | 2 refills | Status: DC
  Filled 2022-10-07: qty 12, 12d supply, fill #0

## 2022-10-09 ENCOUNTER — Other Ambulatory Visit (HOSPITAL_COMMUNITY): Payer: Self-pay

## 2022-10-09 ENCOUNTER — Other Ambulatory Visit: Payer: Self-pay

## 2022-10-09 MED ORDER — NORETHIN ACE-ETH ESTRAD-FE 1-20 MG-MCG PO TABS
1.0000 | ORAL_TABLET | Freq: Every day | ORAL | 4 refills | Status: AC
  Filled 2022-10-09 – 2022-10-29 (×3): qty 84, 84d supply, fill #0

## 2022-10-12 ENCOUNTER — Other Ambulatory Visit (HOSPITAL_COMMUNITY): Payer: Self-pay

## 2022-10-13 ENCOUNTER — Other Ambulatory Visit (HOSPITAL_COMMUNITY): Payer: Self-pay

## 2022-10-14 ENCOUNTER — Other Ambulatory Visit (HOSPITAL_COMMUNITY): Payer: Self-pay

## 2022-10-14 MED ORDER — METHOCARBAMOL 500 MG PO TABS
500.0000 mg | ORAL_TABLET | Freq: Four times a day (QID) | ORAL | 1 refills | Status: AC
  Filled 2022-10-14: qty 240, 30d supply, fill #0

## 2022-10-14 MED ORDER — TIZANIDINE HCL 2 MG PO TABS
2.0000 mg | ORAL_TABLET | Freq: Every evening | ORAL | 0 refills | Status: AC
  Filled 2022-10-14: qty 60, 30d supply, fill #0

## 2022-10-14 MED ORDER — DULOXETINE HCL 60 MG PO CPEP
60.0000 mg | ORAL_CAPSULE | Freq: Every day | ORAL | 1 refills | Status: DC
  Filled 2022-10-14: qty 90, 90d supply, fill #0

## 2022-10-14 MED ORDER — LURASIDONE HCL 20 MG PO TABS
20.0000 mg | ORAL_TABLET | Freq: Every day | ORAL | 0 refills | Status: AC
  Filled 2022-10-14: qty 90, 90d supply, fill #0

## 2022-10-14 MED ORDER — FAMOTIDINE 20 MG PO TABS
20.0000 mg | ORAL_TABLET | Freq: Two times a day (BID) | ORAL | 0 refills | Status: DC
  Filled 2022-10-14 – 2022-10-29 (×2): qty 180, 90d supply, fill #0

## 2022-10-14 MED ORDER — MONTELUKAST SODIUM 10 MG PO TABS
10.0000 mg | ORAL_TABLET | Freq: Every evening | ORAL | 3 refills | Status: DC
  Filled 2022-10-14 – 2022-12-16 (×3): qty 90, 90d supply, fill #0
  Filled 2023-03-16: qty 90, 90d supply, fill #1

## 2022-10-14 MED ORDER — OMEPRAZOLE 20 MG PO CPDR
20.0000 mg | DELAYED_RELEASE_CAPSULE | Freq: Every day | ORAL | 0 refills | Status: DC
  Filled 2022-10-14 – 2022-11-18 (×2): qty 90, 90d supply, fill #0

## 2022-10-14 MED ORDER — NORETHIN ACE-ETH ESTRAD-FE 1-20 MG-MCG PO TABS
1.0000 | ORAL_TABLET | Freq: Every day | ORAL | 0 refills | Status: AC
  Filled 2022-10-14: qty 84, 84d supply, fill #0

## 2022-10-15 ENCOUNTER — Other Ambulatory Visit (HOSPITAL_COMMUNITY): Payer: Self-pay

## 2022-10-22 ENCOUNTER — Other Ambulatory Visit (HOSPITAL_COMMUNITY): Payer: Self-pay

## 2022-10-24 ENCOUNTER — Other Ambulatory Visit (HOSPITAL_COMMUNITY): Payer: Self-pay

## 2022-10-29 ENCOUNTER — Other Ambulatory Visit (HOSPITAL_COMMUNITY): Payer: Self-pay

## 2022-10-30 ENCOUNTER — Other Ambulatory Visit (HOSPITAL_COMMUNITY): Payer: Self-pay

## 2022-11-03 ENCOUNTER — Other Ambulatory Visit (HOSPITAL_COMMUNITY): Payer: Self-pay

## 2022-11-03 MED ORDER — HYDROXYZINE HCL 25 MG PO TABS
25.0000 mg | ORAL_TABLET | Freq: Every evening | ORAL | 3 refills | Status: DC
  Filled 2022-11-03: qty 90, 90d supply, fill #0
  Filled 2023-01-18: qty 90, 90d supply, fill #1
  Filled 2023-04-25: qty 90, 90d supply, fill #2
  Filled 2023-07-27: qty 90, 90d supply, fill #3

## 2022-11-03 MED ORDER — DULOXETINE HCL 60 MG PO CPEP
60.0000 mg | ORAL_CAPSULE | Freq: Every day | ORAL | 3 refills | Status: DC
  Filled 2022-11-03 – 2022-11-25 (×2): qty 90, 90d supply, fill #0
  Filled 2023-02-14: qty 90, 90d supply, fill #1
  Filled 2023-05-06 (×2): qty 90, 90d supply, fill #2

## 2022-11-18 ENCOUNTER — Other Ambulatory Visit (HOSPITAL_COMMUNITY): Payer: Self-pay

## 2022-11-19 ENCOUNTER — Other Ambulatory Visit (HOSPITAL_COMMUNITY): Payer: Self-pay

## 2022-11-25 ENCOUNTER — Other Ambulatory Visit: Payer: Self-pay

## 2022-11-25 ENCOUNTER — Other Ambulatory Visit (HOSPITAL_COMMUNITY): Payer: Self-pay

## 2022-12-10 ENCOUNTER — Other Ambulatory Visit (HOSPITAL_COMMUNITY): Payer: Self-pay

## 2022-12-10 MED ORDER — METHYLPREDNISOLONE 4 MG PO TBPK
ORAL_TABLET | ORAL | 0 refills | Status: DC
  Filled 2022-12-10: qty 21, 6d supply, fill #0

## 2022-12-10 MED ORDER — PREGABALIN 300 MG PO CAPS
300.0000 mg | ORAL_CAPSULE | Freq: Two times a day (BID) | ORAL | 1 refills | Status: DC
  Filled 2022-12-10 – 2022-12-28 (×3): qty 180, 90d supply, fill #0

## 2022-12-10 MED ORDER — METHOCARBAMOL 500 MG PO TABS
500.0000 mg | ORAL_TABLET | Freq: Four times a day (QID) | ORAL | 2 refills | Status: DC
  Filled 2022-12-10: qty 240, 30d supply, fill #0

## 2022-12-15 ENCOUNTER — Other Ambulatory Visit (HOSPITAL_COMMUNITY): Payer: Self-pay

## 2022-12-16 ENCOUNTER — Other Ambulatory Visit (HOSPITAL_COMMUNITY): Payer: Self-pay

## 2022-12-16 ENCOUNTER — Other Ambulatory Visit: Payer: Self-pay

## 2022-12-16 MED ORDER — OXYCODONE HCL 5 MG PO TABS
ORAL_TABLET | ORAL | 0 refills | Status: DC
  Filled 2022-12-16: qty 15, 5d supply, fill #0

## 2022-12-27 ENCOUNTER — Other Ambulatory Visit (HOSPITAL_COMMUNITY): Payer: Self-pay

## 2022-12-28 ENCOUNTER — Other Ambulatory Visit: Payer: Self-pay

## 2022-12-28 ENCOUNTER — Other Ambulatory Visit (HOSPITAL_COMMUNITY): Payer: Self-pay

## 2022-12-28 MED ORDER — OXYCODONE HCL 5 MG PO TABS
ORAL_TABLET | ORAL | 0 refills | Status: DC
  Filled 2022-12-28: qty 10, 3d supply, fill #0

## 2022-12-29 ENCOUNTER — Other Ambulatory Visit (HOSPITAL_COMMUNITY): Payer: Self-pay

## 2023-01-14 ENCOUNTER — Other Ambulatory Visit (HOSPITAL_COMMUNITY): Payer: Self-pay

## 2023-01-14 MED ORDER — ALBUTEROL SULFATE HFA 108 (90 BASE) MCG/ACT IN AERS
1.0000 | INHALATION_SPRAY | RESPIRATORY_TRACT | 1 refills | Status: AC
  Filled 2023-01-14: qty 6.7, 16d supply, fill #0
  Filled 2023-07-27: qty 6.7, 16d supply, fill #1

## 2023-01-18 ENCOUNTER — Other Ambulatory Visit (HOSPITAL_COMMUNITY): Payer: Self-pay

## 2023-01-18 ENCOUNTER — Other Ambulatory Visit: Payer: Self-pay

## 2023-01-19 ENCOUNTER — Other Ambulatory Visit (HOSPITAL_COMMUNITY): Payer: Self-pay

## 2023-01-19 MED ORDER — FAMOTIDINE 20 MG PO TABS
20.0000 mg | ORAL_TABLET | Freq: Two times a day (BID) | ORAL | 3 refills | Status: DC
  Filled 2023-01-19: qty 180, 90d supply, fill #0
  Filled 2023-04-25: qty 180, 90d supply, fill #1
  Filled 2023-07-27: qty 180, 90d supply, fill #2
  Filled 2023-11-11: qty 180, 90d supply, fill #3

## 2023-01-20 ENCOUNTER — Other Ambulatory Visit (HOSPITAL_COMMUNITY): Payer: Self-pay

## 2023-01-20 MED ORDER — AZITHROMYCIN 250 MG PO TABS: ORAL_TABLET | ORAL | 0 refills | Status: DC

## 2023-01-20 MED ORDER — AZITHROMYCIN 250 MG PO TABS
ORAL_TABLET | ORAL | 0 refills | Status: DC
  Filled 2023-01-20: qty 6, 5d supply, fill #0

## 2023-01-21 ENCOUNTER — Other Ambulatory Visit (HOSPITAL_COMMUNITY): Payer: Self-pay

## 2023-02-14 ENCOUNTER — Other Ambulatory Visit (HOSPITAL_COMMUNITY): Payer: Self-pay

## 2023-02-15 ENCOUNTER — Other Ambulatory Visit (HOSPITAL_COMMUNITY): Payer: Self-pay

## 2023-02-15 ENCOUNTER — Other Ambulatory Visit: Payer: Self-pay

## 2023-02-15 MED ORDER — OMEPRAZOLE 20 MG PO CPDR
20.0000 mg | DELAYED_RELEASE_CAPSULE | Freq: Every day | ORAL | 0 refills | Status: DC
  Filled 2023-02-15: qty 90, 90d supply, fill #0

## 2023-02-20 ENCOUNTER — Other Ambulatory Visit (HOSPITAL_COMMUNITY): Payer: Self-pay

## 2023-03-24 ENCOUNTER — Other Ambulatory Visit (HOSPITAL_COMMUNITY): Payer: Self-pay

## 2023-03-24 MED ORDER — CELECOXIB 100 MG PO CAPS
100.0000 mg | ORAL_CAPSULE | Freq: Every day | ORAL | 1 refills | Status: DC
  Filled 2023-03-24: qty 180, 90d supply, fill #0

## 2023-03-26 NOTE — Therapy (Addendum)
OUTPATIENT PHYSICAL THERAPY LOWER EXTREMITY EVALUATION PHYSICAL THERAPY DISCHARGE SUMMARY  Visits from Start of Care: 1  Current functional level related to goals / functional outcomes: unknown   Remaining deficits: unknown   Education / Equipment: Discussed eval findings, rehab rationale, aquatic program progression/POC and pools in area. Patient is in agreement     Patient agrees to discharge. Patient goals were not met. Patient is being discharged due to  Pt decided not to return as she is currently seeing another land based therapy outside [place].   Patient Name: Felicia Ali MRN: [id number] DOB:[birth year], 27 y.o., female Today's Date: 03/27/23  END OF SESSION:  PT End of Session - 03/27/23 1624     Visit Number 1    Number of Visits 4    Date for PT Re-Evaluation 05/29/23    PT Start Time 1045    PT Stop Time 1120    PT Time Calculation (min) 35 min    Activity Tolerance Patient tolerated treatment well              Past Medical History:  Diagnosis Date   Asthma    Chronic back pain    Depression    History reviewed. No pertinent surgical history. Patient Active Problem List   Diagnosis Date Noted   MDD (major depressive disorder), severe (HCC) 12/28/2018    PCP: Merri Brunette, MD   REFERRING PROVIDER: Buena Irish, MD   REFERRING DIAG:  G89.4 (ICD-10-CM) - Chronic pain syndrome  M53.3 (ICD-10-CM) - Sacrococcygeal disorders, not elsewhere classified  M54.50 (ICD-10-CM) - Low back pain, unspecified    THERAPY DIAG:  Chronic pain syndrome  Tendinosis  Rationale for Evaluation and Treatment: Rehabilitation  ONSET DATE: flare up 2022 in hips  SUBJECTIVE:   SUBJECTIVE STATEMENT: Last injections into SIJ created more pain rather than less. MRI completed.  Fell 2 weeks ago went to ER with fx of 5th toe and fibula.  Went to PT originally.  PT helped.  Is better.  Had to move every 15 mins.  I have been doing OPPT for 2 years at  [facility] 1 x week and have been feeling better, core is stronger.  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 Acute left foot/ankle pain: Suspected avulsion fracture of the left distal fibula versus sprain of the ATFL Suspected nondisplaced avulsion fracture base left fifth metatarsal   03/25/23 Felicia Ali is a 26 y.o. being followed at [facility] for treatment of chronic pain localized to her lower back and left buttock region beginning in 2016. She is being seen today for follow up after experiencing an acute pain flare in her lower back and bilateral buttock regions after receiving bilaterally SI joint injections   Chronic pain syndrome  Low back and bilateral buttock pain Piriformis syndrome of left side; SI joint arthropathy   She also has depression and exercise-induced anaphylaxis.   PAIN:  Are you having pain? Yes: NPRS scale: 4-6/10 Pain location: LB into glutes Pain description: constant ache worse in am Aggravating factors: lying/sleeping Relieving factors: bouts of rest; heat; TENs unit, celebrex; lyrica  PRECAUTIONS: None  WEIGHT BEARING RESTRICTIONS:   FALLS:  Has patient fallen in last 6 months? Yes. Number of falls 1  tripped in yard  LIVING ENVIRONMENT: Lives with: lives with their family Lives in: House/apartment Stairs: No Has following equipment at home: None  OCCUPATION: student  PLOF: Independent  PATIENT GOALS: relieve pain  OBJECTIVE:   DIAGNOSTIC FINDINGS: none recent in chart.  shown by pt: early tendinosis of bi glut med tendinosis bilaterally  PATIENT SURVEYS:  FOTO 49% with goal of 63%  COGNITION: Overall cognitive status: Within functional limits for tasks assessed       POSTURE: No Significant postural limitations  PALPATION: TTP bilat mid gluteals   LOWER EXTREMITY ROM:  WFL  LOWER EXTREMITY MMT:  MMT Right eval Left eval  Hip flexion 64.7 61.8  Hip  extension    Hip abduction 66.3 60.1  Hip adduction    Hip internal rotation    Hip external rotation    Knee flexion    Knee extension 65.4   Ankle dorsiflexion    Ankle plantarflexion    Ankle inversion    Ankle eversion     (Blank rows = not tested)   GAIT: Distance walked: 500 Assistive device utilized: None Level of assistance: Complete Independence Comments: Pt currently in boot due to fall left LE   TODAY'S TREATMENT:                                                                                                                              Eval Foto   PATIENT EDUCATION:  Education details: Discussed eval findings, rehab rationale, aquatic program progression/POC and pools in area. Patient is in agreement  Person educated: Patient Education method: Explanation Education comprehension: verbalized understanding  HOME EXERCISE PROGRAM: Completing HEP assigned by (other) OPPT clinic currently attending  ASSESSMENT:  CLINICAL IMPRESSION: Patient is a 27 y.o. f who was seen today for physical therapy evaluation and treatment for Chronic pain syndrome, LBP and Sacrococcygeal disorders. Diagnostics recently completed at [facility] suggest early tendinosis of bilateral glut med  rather than SIJ dysfunction. Additionally she has had a recent fall with potential avulsion fx of left fibula and 5th metatarsal and is in a boot. She presently is attending OPPT at another clinic and has 1 x week for past 2 years. Pt instructed that insurance may not cover aquatic therapy due to participation in another PT clinic.  She does report she has gained a lot of strength and improved mobility in the time since her original flare in 2022. She may be a good candidate for aquatic therapy for instruction on HEP to enhance her strengthening of core and glutes and further decrease pain in gluteal area. She does not have acces to a pool at this point and is uncertain if she will. Plan to see pt if able  (she will clear with ins company) 2-4 visits for instruction on strengthening and Rom aquatic HEP.  OBJECTIVE IMPAIRMENTS: decreased strength, obesity, and pain.   ACTIVITY LIMITATIONS: locomotion level  PARTICIPATION LIMITATIONS: occupation and yard work  PERSONAL FACTORS: Time since onset of injury/illness/exacerbation are also affecting patient's functional outcome.   REHAB POTENTIAL: Good  CLINICAL DECISION MAKING: Unstable/unpredictable  EVALUATION COMPLEXITY: High   GOALS: Goals reviewed with patient? Yes  SHORT TERM GOALS: Target date: 05/22/23 Pt will tolerate full aquatic sessions consistently without increase in pain and with improving function to demonstrate good toleration and effectiveness of intervention.   Baseline: Goal status: INITIAL  2.  Pt to meet stated Foto Goal Baseline:  Goal status: INITIAL  3.  Pt will gain access to pool in order to complete HEP. Baseline: no access Goal status: INITIAL  4.  Pt will be indep with final aquatic HEP for continued management of condition  Baseline: no HEP Goal status: INITIAL  5.  Pt will improve strength in bilat hips by 5 lbs to demonstrate improved overall physical function  Baseline: see chart Goal status: INITIAL    LONG TERM GOALS: To be added if approp   PLAN:  PT FREQUENCY: 1x/week  PT DURATION: 8 weeks max 4 visits  PLANNED INTERVENTIONS: Therapeutic exercises, Therapeutic activity, Neuromuscular re-education, Balance training, Gait training, Patient/Family education, Self Care, Joint mobilization, Joint manipulation, Stair training, Orthotic/Fit training, DME instructions, Aquatic Therapy, Dry Needling, Electrical stimulation, Cryotherapy, Moist heat, Splintting, Taping, Ionotophoresis 4mg /ml Dexamethasone, Manual therapy, and Re-evaluation  PLAN FOR NEXT SESSION: Begin instruction on aquatic HEP for strengthening and ROM bilat glute/hips   Lael Pilch (Frankie) Mickayla Trouten MPT 03/28/2023, 4:27  PM  Addend Corrie Dandy Tomma Lightning) Tyrese Ficek MPT 05/11/23  236p

## 2023-03-27 ENCOUNTER — Ambulatory Visit (HOSPITAL_BASED_OUTPATIENT_CLINIC_OR_DEPARTMENT_OTHER): Payer: BC Managed Care – PPO | Attending: Student | Admitting: Physical Therapy

## 2023-03-27 ENCOUNTER — Other Ambulatory Visit: Payer: Self-pay

## 2023-03-27 DIAGNOSIS — G894 Chronic pain syndrome: Secondary | ICD-10-CM | POA: Insufficient documentation

## 2023-03-27 DIAGNOSIS — M6780 Other specified disorders of synovium and tendon, unspecified site: Secondary | ICD-10-CM | POA: Insufficient documentation

## 2023-03-28 ENCOUNTER — Encounter (HOSPITAL_BASED_OUTPATIENT_CLINIC_OR_DEPARTMENT_OTHER): Payer: Self-pay | Admitting: Physical Therapy

## 2023-03-29 ENCOUNTER — Other Ambulatory Visit: Payer: Self-pay

## 2023-03-29 ENCOUNTER — Other Ambulatory Visit (HOSPITAL_COMMUNITY): Payer: Self-pay

## 2023-03-30 ENCOUNTER — Ambulatory Visit (HOSPITAL_BASED_OUTPATIENT_CLINIC_OR_DEPARTMENT_OTHER): Payer: BC Managed Care – PPO | Admitting: Physical Therapy

## 2023-04-01 ENCOUNTER — Other Ambulatory Visit (HOSPITAL_COMMUNITY): Payer: Self-pay

## 2023-04-27 ENCOUNTER — Ambulatory Visit (HOSPITAL_BASED_OUTPATIENT_CLINIC_OR_DEPARTMENT_OTHER): Payer: BC Managed Care – PPO | Admitting: Physical Therapy

## 2023-05-04 ENCOUNTER — Ambulatory Visit (HOSPITAL_BASED_OUTPATIENT_CLINIC_OR_DEPARTMENT_OTHER): Payer: BC Managed Care – PPO | Admitting: Physical Therapy

## 2023-05-06 ENCOUNTER — Other Ambulatory Visit (HOSPITAL_COMMUNITY): Payer: Self-pay

## 2023-05-17 ENCOUNTER — Other Ambulatory Visit (HOSPITAL_COMMUNITY): Payer: Self-pay

## 2023-05-17 MED ORDER — OMEPRAZOLE 20 MG PO CPDR
20.0000 mg | DELAYED_RELEASE_CAPSULE | Freq: Every day | ORAL | 0 refills | Status: DC
  Filled 2023-05-17: qty 90, 90d supply, fill #0

## 2023-05-18 ENCOUNTER — Ambulatory Visit (HOSPITAL_BASED_OUTPATIENT_CLINIC_OR_DEPARTMENT_OTHER): Payer: BC Managed Care – PPO | Admitting: Physical Therapy

## 2023-05-21 ENCOUNTER — Other Ambulatory Visit (HOSPITAL_COMMUNITY): Payer: Self-pay

## 2023-06-11 ENCOUNTER — Other Ambulatory Visit (HOSPITAL_COMMUNITY): Payer: Self-pay

## 2023-06-14 ENCOUNTER — Other Ambulatory Visit (HOSPITAL_COMMUNITY): Payer: Self-pay

## 2023-06-14 MED ORDER — MONTELUKAST SODIUM 10 MG PO TABS
10.0000 mg | ORAL_TABLET | Freq: Every day | ORAL | 3 refills | Status: DC
  Filled 2023-06-14: qty 90, 90d supply, fill #0
  Filled 2023-09-19: qty 90, 90d supply, fill #1
  Filled 2023-12-19: qty 90, 90d supply, fill #2
  Filled 2024-03-12: qty 90, 90d supply, fill #3

## 2023-06-22 ENCOUNTER — Other Ambulatory Visit (HOSPITAL_COMMUNITY): Payer: Self-pay

## 2023-06-22 MED ORDER — CELECOXIB 100 MG PO CAPS
100.0000 mg | ORAL_CAPSULE | Freq: Every day | ORAL | 1 refills | Status: DC | PRN
  Filled 2023-06-22: qty 180, 90d supply, fill #0

## 2023-06-22 MED ORDER — BACLOFEN 5 MG PO TABS
5.0000 mg | ORAL_TABLET | Freq: Two times a day (BID) | ORAL | 2 refills | Status: DC
  Filled 2023-06-22: qty 60, 30d supply, fill #0
  Filled 2023-07-19: qty 60, 30d supply, fill #1
  Filled 2024-01-24: qty 60, 30d supply, fill #2

## 2023-06-22 MED ORDER — PREGABALIN 300 MG PO CAPS
300.0000 mg | ORAL_CAPSULE | Freq: Two times a day (BID) | ORAL | 1 refills | Status: DC
  Filled 2023-06-23: qty 180, 90d supply, fill #0

## 2023-06-23 ENCOUNTER — Other Ambulatory Visit (HOSPITAL_BASED_OUTPATIENT_CLINIC_OR_DEPARTMENT_OTHER): Payer: Self-pay

## 2023-06-23 ENCOUNTER — Other Ambulatory Visit (HOSPITAL_COMMUNITY): Payer: Self-pay

## 2023-06-23 ENCOUNTER — Other Ambulatory Visit: Payer: Self-pay

## 2023-06-26 ENCOUNTER — Other Ambulatory Visit (HOSPITAL_COMMUNITY): Payer: Self-pay

## 2023-06-29 ENCOUNTER — Emergency Department (HOSPITAL_COMMUNITY): Payer: BC Managed Care – PPO

## 2023-06-29 ENCOUNTER — Emergency Department (HOSPITAL_COMMUNITY)
Admission: EM | Admit: 2023-06-29 | Discharge: 2023-06-29 | Disposition: A | Discharge status: Home / Self Care | Payer: BC Managed Care – PPO | Attending: Emergency Medicine | Admitting: Emergency Medicine

## 2023-06-29 ENCOUNTER — Other Ambulatory Visit: Payer: Self-pay

## 2023-06-29 DIAGNOSIS — M542 Cervicalgia: Secondary | ICD-10-CM | POA: Insufficient documentation

## 2023-06-29 DIAGNOSIS — Y92410 Unspecified street and highway as the place of occurrence of the external cause: Secondary | ICD-10-CM | POA: Insufficient documentation

## 2023-06-29 MED ORDER — OXYCODONE-ACETAMINOPHEN 5-325 MG PO TABS
2.0000 | ORAL_TABLET | Freq: Once | ORAL | Status: AC
  Administered 2023-06-29: 2 via ORAL
  Filled 2023-06-29: qty 2

## 2023-06-29 MED ORDER — ONDANSETRON 4 MG PO TBDP
4.0000 mg | ORAL_TABLET | Freq: Once | ORAL | Status: AC
  Administered 2023-06-29: 4 mg via ORAL
  Filled 2023-06-29: qty 1
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
## 2023-07-28 ENCOUNTER — Other Ambulatory Visit (HOSPITAL_COMMUNITY): Payer: Self-pay

## 2023-08-02 ENCOUNTER — Other Ambulatory Visit (HOSPITAL_COMMUNITY): Payer: Self-pay

## 2023-08-02 MED ORDER — AZITHROMYCIN 500 MG PO TABS
500.0000 mg | ORAL_TABLET | Freq: Every day | ORAL | 0 refills | Status: DC
  Filled 2023-08-02: qty 3, 3d supply, fill #0

## 2023-08-03 ENCOUNTER — Other Ambulatory Visit (HOSPITAL_COMMUNITY): Payer: Self-pay

## 2023-08-03 ENCOUNTER — Other Ambulatory Visit: Payer: Self-pay

## 2023-08-03 MED ORDER — HYDROXYZINE HCL 25 MG PO TABS
25.0000 mg | ORAL_TABLET | Freq: Every evening | ORAL | 3 refills | Status: DC
  Filled 2023-08-03 – 2023-10-30 (×4): qty 90, 90d supply, fill #0
  Filled 2024-01-24: qty 90, 90d supply, fill #1
  Filled 2024-04-20: qty 90, 90d supply, fill #2
  Filled 2024-07-19: qty 90, 90d supply, fill #3

## 2023-08-03 MED ORDER — DULOXETINE HCL 60 MG PO CPEP
60.0000 mg | ORAL_CAPSULE | Freq: Every day | ORAL | 3 refills | Status: DC
  Filled 2023-08-03: qty 90, 90d supply, fill #0
  Filled 2023-11-12: qty 90, 90d supply, fill #1
  Filled 2024-02-13: qty 90, 90d supply, fill #2
  Filled 2024-05-12: qty 90, 90d supply, fill #3

## 2023-08-03 MED ORDER — LURASIDONE HCL 20 MG PO TABS
20.0000 mg | ORAL_TABLET | Freq: Every day | ORAL | 3 refills | Status: AC
Start: 2023-08-03 — End: ?
  Filled 2023-08-03: qty 90, 90d supply, fill #0
  Filled 2023-11-03: qty 90, 90d supply, fill #1
  Filled 2024-02-02: qty 90, 90d supply, fill #2
  Filled 2024-03-12: qty 30, 30d supply, fill #2
  Filled 2024-03-13: qty 30, 30d supply, fill #0
  Filled 2024-04-05 – 2024-04-19 (×3): qty 30, 30d supply, fill #1

## 2023-08-10 ENCOUNTER — Other Ambulatory Visit (HOSPITAL_COMMUNITY): Payer: Self-pay

## 2023-08-13 ENCOUNTER — Other Ambulatory Visit (HOSPITAL_COMMUNITY): Payer: Self-pay

## 2023-08-13 MED ORDER — COVID-19 MRNA VAC-TRIS(PFIZER) 30 MCG/0.3ML IM SUSY
0.3000 mL | PREFILLED_SYRINGE | Freq: Once | INTRAMUSCULAR | 0 refills | Status: AC
  Filled 2023-08-13: qty 0.3, 1d supply, fill #0

## 2023-08-16 ENCOUNTER — Other Ambulatory Visit (HOSPITAL_COMMUNITY): Payer: Self-pay

## 2023-08-22 ENCOUNTER — Other Ambulatory Visit (HOSPITAL_COMMUNITY): Payer: Self-pay

## 2023-08-23 ENCOUNTER — Other Ambulatory Visit (HOSPITAL_COMMUNITY): Payer: Self-pay

## 2023-08-23 MED ORDER — OMEPRAZOLE 20 MG PO CPDR
DELAYED_RELEASE_CAPSULE | ORAL | 0 refills | Status: DC
  Filled 2023-08-23: qty 90, 90d supply, fill #0

## 2023-08-24 ENCOUNTER — Other Ambulatory Visit (HOSPITAL_COMMUNITY): Payer: Self-pay

## 2023-08-26 ENCOUNTER — Other Ambulatory Visit (HOSPITAL_COMMUNITY): Payer: Self-pay

## 2023-09-14 ENCOUNTER — Other Ambulatory Visit (HOSPITAL_COMMUNITY): Payer: Self-pay

## 2023-09-14 MED ORDER — BACLOFEN 5 MG PO TABS
5.0000 mg | ORAL_TABLET | Freq: Two times a day (BID) | ORAL | 2 refills | Status: DC
  Filled 2023-09-14: qty 60, 30d supply, fill #0
  Filled 2023-10-30: qty 60, 30d supply, fill #1
  Filled 2024-03-18: qty 60, 30d supply, fill #2

## 2023-09-14 MED ORDER — PREGABALIN 300 MG PO CAPS
300.0000 mg | ORAL_CAPSULE | Freq: Two times a day (BID) | ORAL | 0 refills | Status: AC
  Filled 2023-09-14 – 2023-10-01 (×3): qty 180, 90d supply, fill #0

## 2023-09-14 MED ORDER — LIDOCAINE 5 % EX PTCH
MEDICATED_PATCH | CUTANEOUS | 2 refills | Status: DC
  Filled 2023-09-14: qty 60, 30d supply, fill #0

## 2023-09-17 ENCOUNTER — Other Ambulatory Visit (HOSPITAL_COMMUNITY): Payer: Self-pay

## 2023-09-17 MED ORDER — OXYCODONE HCL 5 MG PO TABS
5.0000 mg | ORAL_TABLET | Freq: Three times a day (TID) | ORAL | 0 refills | Status: AC | PRN
  Filled 2023-09-17: qty 10, 4d supply, fill #0

## 2023-09-20 ENCOUNTER — Other Ambulatory Visit: Payer: Self-pay

## 2023-09-21 ENCOUNTER — Other Ambulatory Visit (HOSPITAL_COMMUNITY): Payer: Self-pay

## 2023-09-24 ENCOUNTER — Other Ambulatory Visit: Payer: Self-pay

## 2023-09-24 ENCOUNTER — Other Ambulatory Visit (HOSPITAL_COMMUNITY): Payer: Self-pay

## 2023-10-01 ENCOUNTER — Other Ambulatory Visit: Payer: Self-pay

## 2023-10-01 ENCOUNTER — Other Ambulatory Visit (HOSPITAL_COMMUNITY): Payer: Self-pay

## 2023-10-04 ENCOUNTER — Other Ambulatory Visit (HOSPITAL_COMMUNITY): Payer: Self-pay

## 2023-10-30 ENCOUNTER — Other Ambulatory Visit (HOSPITAL_COMMUNITY): Payer: Self-pay

## 2023-11-03 ENCOUNTER — Other Ambulatory Visit (HOSPITAL_BASED_OUTPATIENT_CLINIC_OR_DEPARTMENT_OTHER): Payer: Self-pay

## 2023-11-03 ENCOUNTER — Other Ambulatory Visit: Payer: Self-pay

## 2023-11-17 ENCOUNTER — Other Ambulatory Visit (HOSPITAL_COMMUNITY): Payer: Self-pay

## 2023-11-17 MED ORDER — OMEPRAZOLE 20 MG PO CPDR
20.0000 mg | DELAYED_RELEASE_CAPSULE | Freq: Every day | ORAL | 0 refills | Status: DC
  Filled 2023-11-17: qty 90, 90d supply, fill #0

## 2023-11-20 ENCOUNTER — Other Ambulatory Visit (HOSPITAL_COMMUNITY): Payer: Self-pay

## 2023-12-07 ENCOUNTER — Other Ambulatory Visit: Payer: Self-pay

## 2023-12-07 ENCOUNTER — Other Ambulatory Visit (HOSPITAL_COMMUNITY): Payer: Self-pay

## 2023-12-07 MED ORDER — BACLOFEN 5 MG PO TABS
5.0000 mg | ORAL_TABLET | Freq: Three times a day (TID) | ORAL | 2 refills | Status: DC
  Filled 2023-12-07: qty 90, 30d supply, fill #0
  Filled 2024-02-02: qty 90, 30d supply, fill #1
  Filled 2024-04-05: qty 90, 30d supply, fill #2

## 2023-12-07 MED ORDER — PREGABALIN 300 MG PO CAPS
300.0000 mg | ORAL_CAPSULE | Freq: Two times a day (BID) | ORAL | 0 refills | Status: AC
  Filled 2023-12-07 – 2023-12-31 (×5): qty 180, 90d supply, fill #0

## 2023-12-07 MED ORDER — CELECOXIB 100 MG PO CAPS
100.0000 mg | ORAL_CAPSULE | Freq: Every day | ORAL | 1 refills | Status: DC | PRN
  Filled 2023-12-07: qty 180, 90d supply, fill #0

## 2023-12-11 ENCOUNTER — Other Ambulatory Visit (HOSPITAL_COMMUNITY): Payer: Self-pay

## 2023-12-20 ENCOUNTER — Other Ambulatory Visit: Payer: Self-pay

## 2023-12-20 ENCOUNTER — Other Ambulatory Visit (HOSPITAL_COMMUNITY): Payer: Self-pay

## 2023-12-27 ENCOUNTER — Other Ambulatory Visit (HOSPITAL_COMMUNITY): Payer: Self-pay

## 2023-12-27 ENCOUNTER — Other Ambulatory Visit: Payer: Self-pay

## 2023-12-27 ENCOUNTER — Other Ambulatory Visit (HOSPITAL_BASED_OUTPATIENT_CLINIC_OR_DEPARTMENT_OTHER): Payer: Self-pay

## 2023-12-28 ENCOUNTER — Other Ambulatory Visit (HOSPITAL_COMMUNITY): Payer: Self-pay

## 2023-12-28 ENCOUNTER — Other Ambulatory Visit: Payer: Self-pay

## 2023-12-31 ENCOUNTER — Other Ambulatory Visit (HOSPITAL_COMMUNITY): Payer: Self-pay

## 2023-12-31 ENCOUNTER — Other Ambulatory Visit: Payer: Self-pay

## 2024-01-18 ENCOUNTER — Other Ambulatory Visit (HOSPITAL_COMMUNITY): Payer: Self-pay

## 2024-01-18 MED ORDER — IPRATROPIUM BROMIDE 0.06 % NA SOLN
2.0000 | Freq: Two times a day (BID) | NASAL | 1 refills | Status: DC | PRN
  Filled 2024-01-18: qty 15, 38d supply, fill #0

## 2024-01-19 ENCOUNTER — Other Ambulatory Visit (HOSPITAL_COMMUNITY): Payer: Self-pay

## 2024-01-19 ENCOUNTER — Other Ambulatory Visit: Payer: Self-pay

## 2024-02-08 ENCOUNTER — Other Ambulatory Visit (HOSPITAL_COMMUNITY): Payer: Self-pay

## 2024-02-11 ENCOUNTER — Other Ambulatory Visit: Payer: Self-pay

## 2024-02-11 ENCOUNTER — Other Ambulatory Visit (HOSPITAL_COMMUNITY): Payer: Self-pay

## 2024-02-14 ENCOUNTER — Other Ambulatory Visit (HOSPITAL_COMMUNITY): Payer: Self-pay

## 2024-02-14 MED ORDER — OMEPRAZOLE 20 MG PO CPDR
DELAYED_RELEASE_CAPSULE | ORAL | 1 refills | Status: DC
  Filled 2024-02-14: qty 90, 90d supply, fill #0
  Filled 2024-05-12: qty 90, 90d supply, fill #1

## 2024-02-15 ENCOUNTER — Other Ambulatory Visit (HOSPITAL_COMMUNITY): Payer: Self-pay

## 2024-02-15 MED ORDER — MOMETASONE FUROATE 0.1 % EX OINT
TOPICAL_OINTMENT | CUTANEOUS | 1 refills | Status: AC
  Filled 2024-02-15: qty 45, 30d supply, fill #0

## 2024-02-18 ENCOUNTER — Other Ambulatory Visit: Payer: Self-pay

## 2024-02-21 ENCOUNTER — Other Ambulatory Visit (HOSPITAL_COMMUNITY): Payer: Self-pay

## 2024-02-23 ENCOUNTER — Other Ambulatory Visit (HOSPITAL_COMMUNITY): Payer: Self-pay

## 2024-02-23 MED ORDER — FAMOTIDINE 20 MG PO TABS
20.0000 mg | ORAL_TABLET | Freq: Two times a day (BID) | ORAL | 3 refills | Status: AC
  Filled 2024-02-23: qty 180, 90d supply, fill #0
  Filled 2024-05-19: qty 180, 90d supply, fill #1
  Filled 2024-08-13: qty 180, 90d supply, fill #2
  Filled 2024-11-15: qty 180, 90d supply, fill #3

## 2024-02-24 ENCOUNTER — Other Ambulatory Visit (HOSPITAL_COMMUNITY): Payer: Self-pay

## 2024-02-25 ENCOUNTER — Other Ambulatory Visit (HOSPITAL_COMMUNITY): Payer: Self-pay

## 2024-02-26 ENCOUNTER — Other Ambulatory Visit (HOSPITAL_COMMUNITY): Payer: Self-pay

## 2024-02-29 ENCOUNTER — Other Ambulatory Visit (HOSPITAL_COMMUNITY): Payer: Self-pay

## 2024-03-01 ENCOUNTER — Other Ambulatory Visit (HOSPITAL_COMMUNITY): Payer: Self-pay

## 2024-03-03 ENCOUNTER — Other Ambulatory Visit (HOSPITAL_COMMUNITY): Payer: Self-pay

## 2024-03-03 ENCOUNTER — Other Ambulatory Visit: Payer: Self-pay

## 2024-03-03 MED ORDER — PREGABALIN 300 MG PO CAPS
300.0000 mg | ORAL_CAPSULE | Freq: Two times a day (BID) | ORAL | 2 refills | Status: AC
  Filled 2024-03-03 – 2024-03-28 (×3): qty 180, 90d supply, fill #0

## 2024-03-03 MED ORDER — CELECOXIB 100 MG PO CAPS
100.0000 mg | ORAL_CAPSULE | Freq: Every day | ORAL | 1 refills | Status: DC | PRN
  Filled 2024-03-03: qty 180, 90d supply, fill #0
  Filled 2024-03-03: qty 32, 16d supply, fill #0
  Filled 2024-03-03: qty 148, 74d supply, fill #0

## 2024-03-03 MED ORDER — BACLOFEN 5 MG PO TABS
5.0000 mg | ORAL_TABLET | Freq: Three times a day (TID) | ORAL | 2 refills | Status: AC
  Filled 2024-03-03 – 2024-05-05 (×3): qty 270, 90d supply, fill #0
  Filled 2024-08-03: qty 270, 90d supply, fill #1
  Filled 2024-10-29: qty 270, 90d supply, fill #2

## 2024-03-06 ENCOUNTER — Other Ambulatory Visit (HOSPITAL_COMMUNITY): Payer: Self-pay

## 2024-03-06 ENCOUNTER — Other Ambulatory Visit: Payer: Self-pay

## 2024-03-08 ENCOUNTER — Other Ambulatory Visit (HOSPITAL_COMMUNITY): Payer: Self-pay

## 2024-03-08 ENCOUNTER — Other Ambulatory Visit: Payer: Self-pay

## 2024-03-13 ENCOUNTER — Other Ambulatory Visit (HOSPITAL_COMMUNITY): Payer: Self-pay

## 2024-03-14 ENCOUNTER — Other Ambulatory Visit (HOSPITAL_COMMUNITY): Payer: Self-pay

## 2024-03-15 ENCOUNTER — Other Ambulatory Visit (HOSPITAL_COMMUNITY): Payer: Self-pay

## 2024-03-15 MED ORDER — LURASIDONE HCL 20 MG PO TABS
20.0000 mg | ORAL_TABLET | Freq: Every day | ORAL | 3 refills | Status: DC
  Filled 2024-03-15: qty 90, 90d supply, fill #0

## 2024-03-16 ENCOUNTER — Other Ambulatory Visit (HOSPITAL_COMMUNITY): Payer: Self-pay

## 2024-03-17 ENCOUNTER — Other Ambulatory Visit (HOSPITAL_COMMUNITY): Payer: Self-pay

## 2024-03-23 ENCOUNTER — Encounter (HOSPITAL_COMMUNITY): Payer: Self-pay

## 2024-03-23 ENCOUNTER — Other Ambulatory Visit (HOSPITAL_COMMUNITY): Payer: Self-pay

## 2024-03-27 ENCOUNTER — Other Ambulatory Visit (HOSPITAL_COMMUNITY): Payer: Self-pay

## 2024-03-28 ENCOUNTER — Other Ambulatory Visit (HOSPITAL_COMMUNITY): Payer: Self-pay

## 2024-03-28 ENCOUNTER — Other Ambulatory Visit: Payer: Self-pay

## 2024-03-28 MED ORDER — LURASIDONE HCL 40 MG PO TABS
40.0000 mg | ORAL_TABLET | Freq: Every day | ORAL | 3 refills | Status: AC
  Filled 2024-03-28: qty 90, 90d supply, fill #0
  Filled 2024-06-21: qty 90, 90d supply, fill #1
  Filled 2024-09-19: qty 90, 90d supply, fill #2

## 2024-03-29 ENCOUNTER — Other Ambulatory Visit (HOSPITAL_COMMUNITY): Payer: Self-pay

## 2024-03-29 ENCOUNTER — Other Ambulatory Visit (HOSPITAL_BASED_OUTPATIENT_CLINIC_OR_DEPARTMENT_OTHER): Payer: Self-pay

## 2024-03-29 ENCOUNTER — Other Ambulatory Visit: Payer: Self-pay

## 2024-04-05 ENCOUNTER — Other Ambulatory Visit (HOSPITAL_COMMUNITY): Payer: Self-pay

## 2024-04-17 ENCOUNTER — Other Ambulatory Visit (HOSPITAL_COMMUNITY): Payer: Self-pay

## 2024-04-18 ENCOUNTER — Other Ambulatory Visit (HOSPITAL_COMMUNITY): Payer: Self-pay

## 2024-04-19 ENCOUNTER — Other Ambulatory Visit (HOSPITAL_COMMUNITY): Payer: Self-pay

## 2024-05-05 ENCOUNTER — Other Ambulatory Visit (HOSPITAL_COMMUNITY): Payer: Self-pay

## 2024-05-05 ENCOUNTER — Other Ambulatory Visit: Payer: Self-pay

## 2024-05-24 ENCOUNTER — Other Ambulatory Visit (HOSPITAL_COMMUNITY): Payer: Self-pay

## 2024-05-24 MED ORDER — BACLOFEN 5 MG PO TABS: 5.0000 mg | ORAL_TABLET | Freq: Three times a day (TID) | ORAL | 2 refills | Status: DC

## 2024-05-24 MED ORDER — PREGABALIN 300 MG PO CAPS
300.0000 mg | ORAL_CAPSULE | Freq: Two times a day (BID) | ORAL | 2 refills | Status: AC
  Filled 2024-06-26: qty 180, 90d supply, fill #0
  Filled 2024-09-24: qty 180, 90d supply, fill #1

## 2024-06-12 ENCOUNTER — Other Ambulatory Visit (HOSPITAL_COMMUNITY): Payer: Self-pay

## 2024-06-14 ENCOUNTER — Other Ambulatory Visit (HOSPITAL_COMMUNITY): Payer: Self-pay

## 2024-06-14 MED ORDER — MONTELUKAST SODIUM 10 MG PO TABS
10.0000 mg | ORAL_TABLET | Freq: Every day | ORAL | 3 refills | Status: AC
  Filled 2024-06-14: qty 90, 90d supply, fill #0
  Filled 2024-09-06: qty 90, 90d supply, fill #1

## 2024-06-21 ENCOUNTER — Other Ambulatory Visit (HOSPITAL_COMMUNITY): Payer: Self-pay

## 2024-06-26 ENCOUNTER — Other Ambulatory Visit (HOSPITAL_COMMUNITY): Payer: Self-pay

## 2024-06-27 ENCOUNTER — Other Ambulatory Visit: Payer: Self-pay

## 2024-07-26 ENCOUNTER — Other Ambulatory Visit (HOSPITAL_COMMUNITY): Payer: Self-pay

## 2024-07-26 MED ORDER — TOPIRAMATE 25 MG PO CPSP
25.0000 mg | ORAL_CAPSULE | Freq: Every day | ORAL | 2 refills | Status: AC
  Filled 2024-07-26: qty 30, 30d supply, fill #0
  Filled 2024-08-13 – 2024-08-19 (×2): qty 30, 30d supply, fill #1
  Filled 2024-09-18 – 2024-11-22 (×7): qty 30, 30d supply, fill #2

## 2024-07-27 ENCOUNTER — Other Ambulatory Visit (HOSPITAL_COMMUNITY): Payer: Self-pay

## 2024-08-03 ENCOUNTER — Other Ambulatory Visit (HOSPITAL_COMMUNITY): Payer: Self-pay

## 2024-08-04 ENCOUNTER — Other Ambulatory Visit (HOSPITAL_COMMUNITY): Payer: Self-pay

## 2024-08-04 ENCOUNTER — Encounter (HOSPITAL_COMMUNITY): Payer: Self-pay

## 2024-08-04 MED ORDER — COVID-19 MRNA VAC-TRIS(PFIZER) 30 MCG/0.3ML IM SUSY
PREFILLED_SYRINGE | INTRAMUSCULAR | 0 refills | Status: AC
  Filled 2024-08-04: qty 0.3, 1d supply, fill #0

## 2024-08-04 MED ORDER — FLUZONE 0.5 ML IM SUSY
0.5000 mL | PREFILLED_SYRINGE | Freq: Once | INTRAMUSCULAR | 0 refills | Status: AC
  Filled 2024-08-04: qty 0.5, 1d supply, fill #0

## 2024-08-10 ENCOUNTER — Other Ambulatory Visit: Payer: Self-pay

## 2024-08-10 ENCOUNTER — Other Ambulatory Visit (HOSPITAL_COMMUNITY): Payer: Self-pay

## 2024-08-10 MED ORDER — DULOXETINE HCL 60 MG PO CPEP
60.0000 mg | ORAL_CAPSULE | Freq: Every day | ORAL | 3 refills | Status: AC
  Filled 2024-08-10: qty 90, 90d supply, fill #0
  Filled 2024-11-08: qty 90, 90d supply, fill #1

## 2024-08-10 MED ORDER — OMEPRAZOLE 20 MG PO CPDR
20.0000 mg | DELAYED_RELEASE_CAPSULE | Freq: Every day | ORAL | 1 refills | Status: AC
  Filled 2024-08-10: qty 90, 90d supply, fill #0
  Filled 2024-11-01: qty 90, 90d supply, fill #1

## 2024-08-13 ENCOUNTER — Other Ambulatory Visit (HOSPITAL_COMMUNITY): Payer: Self-pay

## 2024-08-17 ENCOUNTER — Other Ambulatory Visit (HOSPITAL_COMMUNITY): Payer: Self-pay

## 2024-08-18 ENCOUNTER — Other Ambulatory Visit: Payer: Self-pay

## 2024-08-24 ENCOUNTER — Other Ambulatory Visit (HOSPITAL_COMMUNITY): Payer: Self-pay

## 2024-08-24 MED ORDER — TOPIRAMATE 25 MG PO CPSP
50.0000 mg | ORAL_CAPSULE | Freq: Every day | ORAL | 0 refills | Status: AC
  Filled 2024-08-25 – 2024-08-30 (×2): qty 180, 90d supply, fill #0

## 2024-08-24 MED ORDER — MELOXICAM 7.5 MG PO TABS
7.5000 mg | ORAL_TABLET | Freq: Two times a day (BID) | ORAL | 2 refills | Status: DC | PRN
  Filled 2024-08-24: qty 60, 30d supply, fill #0

## 2024-08-25 ENCOUNTER — Other Ambulatory Visit: Payer: Self-pay

## 2024-08-25 ENCOUNTER — Other Ambulatory Visit (HOSPITAL_COMMUNITY): Payer: Self-pay

## 2024-08-30 ENCOUNTER — Other Ambulatory Visit (HOSPITAL_COMMUNITY): Payer: Self-pay

## 2024-08-30 ENCOUNTER — Other Ambulatory Visit: Payer: Self-pay

## 2024-09-18 ENCOUNTER — Other Ambulatory Visit (HOSPITAL_COMMUNITY): Payer: Self-pay

## 2024-09-22 ENCOUNTER — Other Ambulatory Visit (HOSPITAL_COMMUNITY): Payer: Self-pay

## 2024-09-22 ENCOUNTER — Other Ambulatory Visit: Payer: Self-pay

## 2024-09-23 ENCOUNTER — Other Ambulatory Visit (HOSPITAL_COMMUNITY): Payer: Self-pay

## 2024-09-24 ENCOUNTER — Other Ambulatory Visit (HOSPITAL_COMMUNITY): Payer: Self-pay

## 2024-09-25 ENCOUNTER — Other Ambulatory Visit: Payer: Self-pay

## 2024-09-25 ENCOUNTER — Other Ambulatory Visit (HOSPITAL_COMMUNITY): Payer: Self-pay

## 2024-10-03 ENCOUNTER — Other Ambulatory Visit (HOSPITAL_COMMUNITY): Payer: Self-pay

## 2024-10-04 ENCOUNTER — Other Ambulatory Visit (HOSPITAL_COMMUNITY): Payer: Self-pay

## 2024-10-23 ENCOUNTER — Other Ambulatory Visit: Payer: Self-pay

## 2024-10-23 ENCOUNTER — Other Ambulatory Visit (HOSPITAL_COMMUNITY): Payer: Self-pay

## 2024-10-23 MED ORDER — BACLOFEN 10 MG PO TABS
10.0000 mg | ORAL_TABLET | Freq: Three times a day (TID) | ORAL | 2 refills | Status: AC | PRN
  Filled 2024-10-23: qty 90, 30d supply, fill #0

## 2024-10-23 MED ORDER — PREGABALIN 300 MG PO CAPS
300.0000 mg | ORAL_CAPSULE | Freq: Two times a day (BID) | ORAL | 2 refills | Status: AC
  Filled 2024-10-23: qty 180, 90d supply, fill #0

## 2024-10-23 MED ORDER — TOPIRAMATE 50 MG PO CPSP
50.0000 mg | ORAL_CAPSULE | Freq: Every day | ORAL | 2 refills | Status: AC
  Filled 2024-10-23: qty 30, 30d supply, fill #0
  Filled 2024-11-19: qty 30, 30d supply, fill #1

## 2024-10-23 MED ORDER — CELECOXIB 100 MG PO CAPS
100.0000 mg | ORAL_CAPSULE | Freq: Every day | ORAL | 2 refills | Status: AC
  Filled 2024-10-23: qty 30, 30d supply, fill #0

## 2024-10-24 ENCOUNTER — Other Ambulatory Visit (HOSPITAL_COMMUNITY): Payer: Self-pay

## 2024-10-24 ENCOUNTER — Other Ambulatory Visit: Payer: Self-pay

## 2024-11-19 ENCOUNTER — Other Ambulatory Visit (HOSPITAL_COMMUNITY): Payer: Self-pay

## 2024-11-19 ENCOUNTER — Telehealth: Payer: Self-pay | Admitting: Family

## 2024-11-19 DIAGNOSIS — L03317 Cellulitis of buttock: Secondary | ICD-10-CM | POA: Diagnosis not present

## 2024-11-19 MED ORDER — SULFAMETHOXAZOLE-TRIMETHOPRIM 800-160 MG PO TABS
1.0000 | ORAL_TABLET | Freq: Two times a day (BID) | ORAL | 0 refills | Status: DC
  Filled 2024-11-19: qty 14, 7d supply, fill #0

## 2024-11-19 NOTE — Progress Notes (Signed)
 E Visit for Cellulitis  We are sorry that you are not feeling well. Here is how we plan to help!  Based on what you shared, it appears you may have cellulitis.   Cellulitis is a bacterial skin infection that typically presents with redness, swelling, warmth, and tenderness in the affected area. Small red spots, minor bleeding under the skin, and fluid-filled blisters may also appear. Fever can sometimes accompany the infection.    Cellulitis usually affects only one side of the body, with the lower limbs being the most commonly involved area  I have prescribed:  Bactrim  DS -- Take 1 tablet by mouth twice a day for 7 days  HOME CARE:  Take all medications as prescribed and be sure to complete the full course - even if your skin appears to be improving.   GET HELP RIGHT AWAY IF:  Symptoms do not begin to improve within 48 hours. Severe redness persists or worsens. If the area turns color, spreads or swells. If it blisters and opens, develops yellow-brown crust or bleeds. You develop a fever or chills. If the pain increases or becomes unbearable.  Are unable to keep fluids and food down.  MAKE SURE YOU   Understand these instructions. Will watch your condition. Will get help right away if you are not doing well or get worse.  Thank you for choosing an e-visit.   Your e-visit answers were reviewed by a board certified advanced clinical practitioner to complete your personal care plan. Depending upon the condition, your plan could have included both over the counter or prescription medications.   Please review your pharmacy choice. Make sure the pharmacy is open so you can pick up the prescription now. If there is a problem, you may contact your provider through [employer] and have the prescription routed to another pharmacy.   Your safety is important to us . If you have drug allergies, check your prescription carefully.   For the next 24 hours you can use MyChart to ask  questions about today's visit, request a non-urgent call back, or ask for a work or school excuse.   You will receive an email in the next two days asking about your experience. I hope that your e-visit has been valuable and will speed up your recovery.    I have spent 5 minutes in review of e-visit questionnaire, review and updating patient chart, medical decision making and response to patient.   Bari Learn, FNP

## 2024-11-20 ENCOUNTER — Other Ambulatory Visit (HOSPITAL_COMMUNITY): Payer: Self-pay

## 2024-11-20 ENCOUNTER — Other Ambulatory Visit: Payer: Self-pay

## 2024-11-23 ENCOUNTER — Other Ambulatory Visit (HOSPITAL_COMMUNITY): Payer: Self-pay

## 2024-11-28 ENCOUNTER — Other Ambulatory Visit (HOSPITAL_COMMUNITY): Payer: Self-pay

## 2024-11-28 MED ORDER — SULFAMETHOXAZOLE-TRIMETHOPRIM 800-160 MG PO TABS
1.0000 | ORAL_TABLET | Freq: Two times a day (BID) | ORAL | 0 refills | Status: AC
  Filled 2024-11-28: qty 14, 7d supply, fill #0

## 2024-11-28 MED ORDER — HYDROXYZINE HCL 25 MG PO TABS
25.0000 mg | ORAL_TABLET | ORAL | 3 refills | Status: AC
  Filled 2024-11-28: qty 90, 90d supply, fill #0

## 2024-11-28 MED ORDER — OXYCODONE HCL 5 MG PO TABS
5.0000 mg | ORAL_TABLET | Freq: Four times a day (QID) | ORAL | 0 refills | Status: AC | PRN
  Filled 2024-11-28: qty 10, 3d supply, fill #0

## 2024-11-29 ENCOUNTER — Other Ambulatory Visit: Payer: Self-pay
